# Patient Record
Sex: Female | Born: 1950
Health system: Southern US, Community
[De-identification: ages and names within clinical notes are randomized; demographics above are authoritative.]

## PROBLEM LIST (undated history)

## (undated) DIAGNOSIS — E559 Vitamin D deficiency, unspecified: Secondary | ICD-10-CM

## (undated) DIAGNOSIS — F5101 Primary insomnia: Secondary | ICD-10-CM

## (undated) DIAGNOSIS — R5382 Chronic fatigue, unspecified: Secondary | ICD-10-CM

## (undated) HISTORY — DX: Chronic fatigue, unspecified: R53.82

## (undated) HISTORY — PX: OTHER SURGICAL HISTORY: SHX169

## (undated) HISTORY — PX: VENTRAL HERNIA REPAIR: SHX424

## (undated) HISTORY — DX: Vitamin D deficiency, unspecified: E55.9

## (undated) HISTORY — DX: Primary insomnia: F51.01

---

## 2010-04-04 HISTORY — PX: COLON SURGERY: SHX602

## 2011-09-24 ENCOUNTER — Other Ambulatory Visit: Payer: Self-pay | Admitting: Surgery

## 2011-09-29 ENCOUNTER — Ambulatory Visit
Admission: RE | Admit: 2011-09-29 | Discharge: 2011-09-29 | Disposition: A | Discharge status: Home / Self Care | Payer: Self-pay | Source: Ambulatory Visit | Attending: Surgery | Admitting: Surgery

## 2011-09-29 MED ORDER — IOHEXOL 300 MG/ML  SOLN
100.0000 mL | Freq: Once | INTRAMUSCULAR | Status: AC | PRN
  Administered 2011-09-29: 100 mL via INTRAVENOUS

## 2013-08-10 IMAGING — CT CT ABD-PELV W/ CM
3 of 5 series · 11 of 36 positions shown, 17 images · IV contrast (READICAT/WATER & [ID] OMNI 300)
Comparison: CT abdomen pelvis of 09/25/2010

***ADDENDUM*** CREATED: 10/06/2011 [DATE]

Correction to the dictation of the CT abdomen and pelvis of
09/29/2011:
Under FINDINGS, second paragraph, the last sentence should read "
There also appears to be a small area of enhancement within the
musculature of the anterior lower pelvic abdominal wall consistent
with an abscess of approximately 2.6 x 1.7 cm."
***END ADDENDUM*** SIGNED BY: Nohora Esperanza Mikan, M.D.
CLINICAL DATA: Left lower quadrant tenderness and pain, history of
small bowel obstruction with surgery in April 2010 with
ileostomy resection
CT ABDOMEN AND PELVIS WITH CONTRAST
TECHNIQUE: Multidetector CT imaging of the abdomen and pelvis was
performed following the standard protocol during bolus
administration of intravenous contrast.
Contrast: 100mL OMNIPAQUE IOHEXOL 300 MG/ML  SOLN

[Series 3: abd/pelvis with · axial · 0.80mm/px · z∈[-364,-59]mm · 7 of 79 slices shown, 12 images]
[im 10/79  soft-tissue]
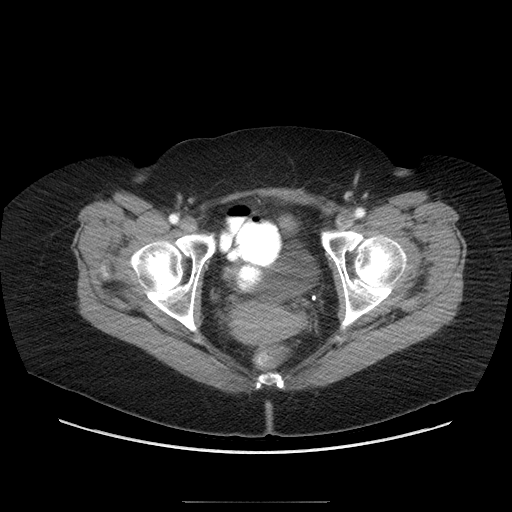
[im 10/79  bone]
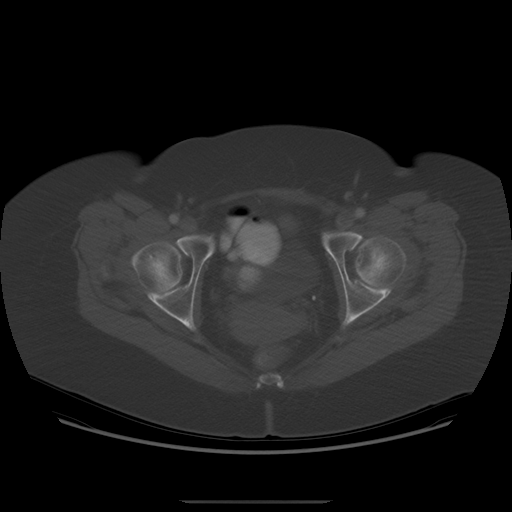
[im 20/79  soft-tissue]
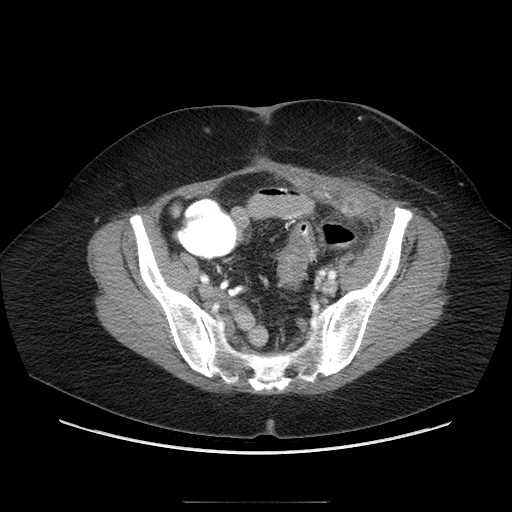
[im 30/79  soft-tissue]
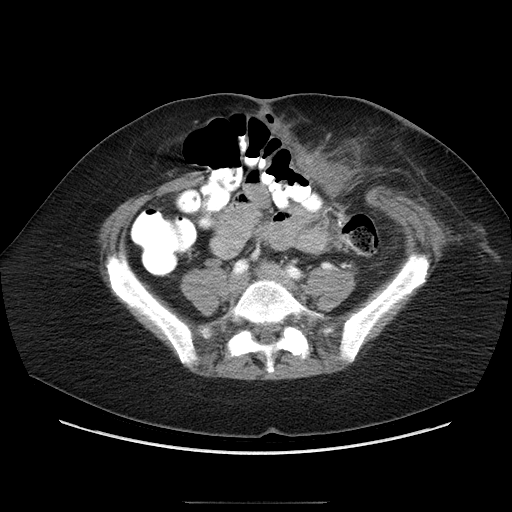
[im 40/79  soft-tissue]
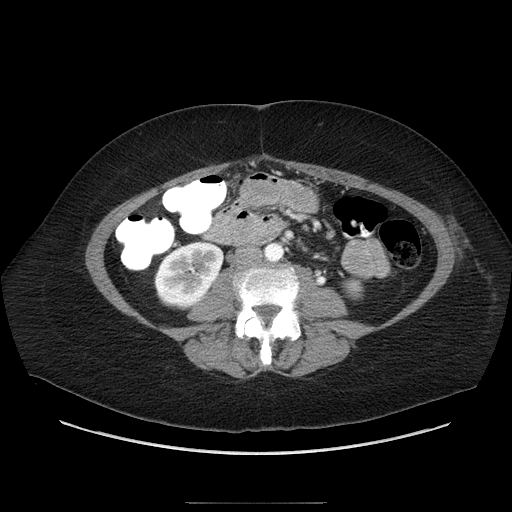
[im 40/79  lung]
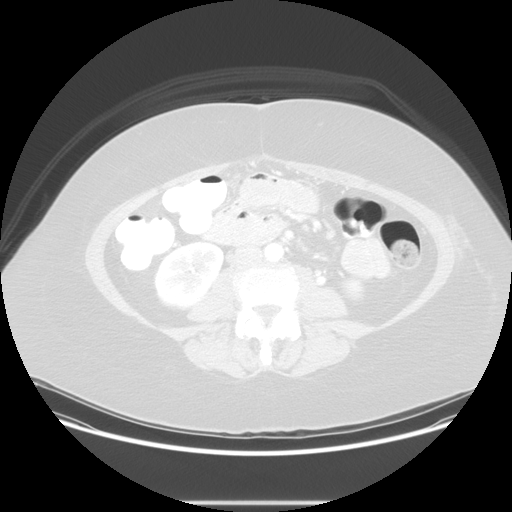
[im 49/79  soft-tissue]
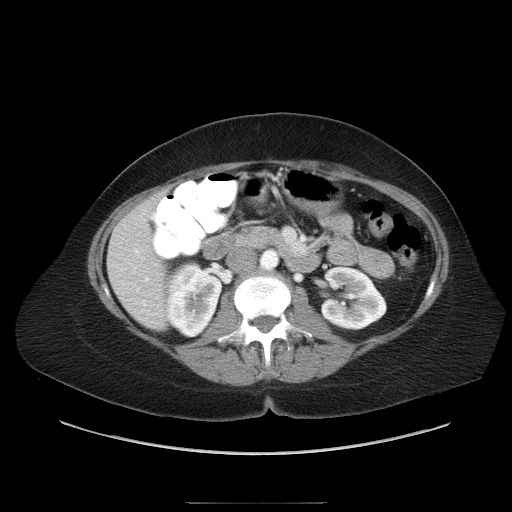
[im 49/79  lung]
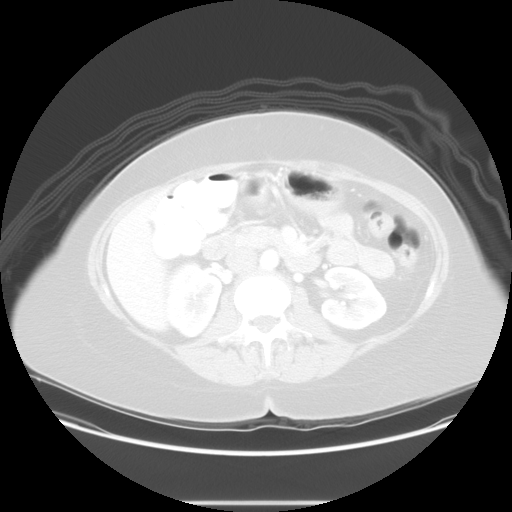
[im 59/79  soft-tissue]
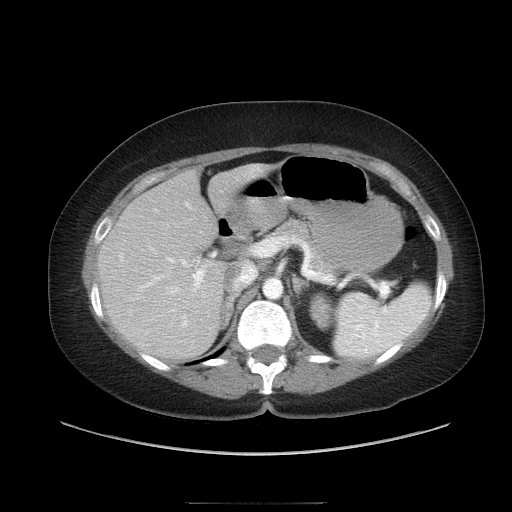
[im 59/79  lung]
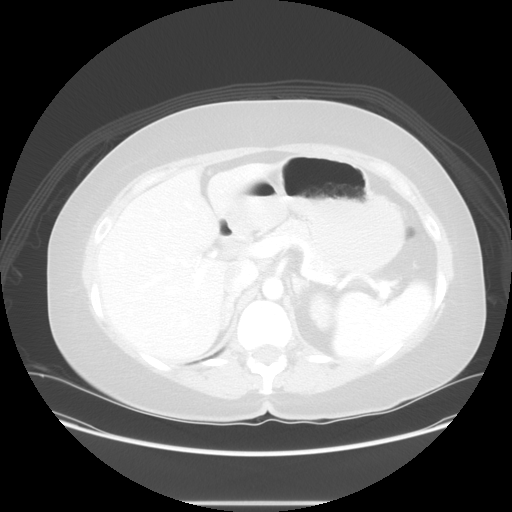
[im 69/79  soft-tissue]
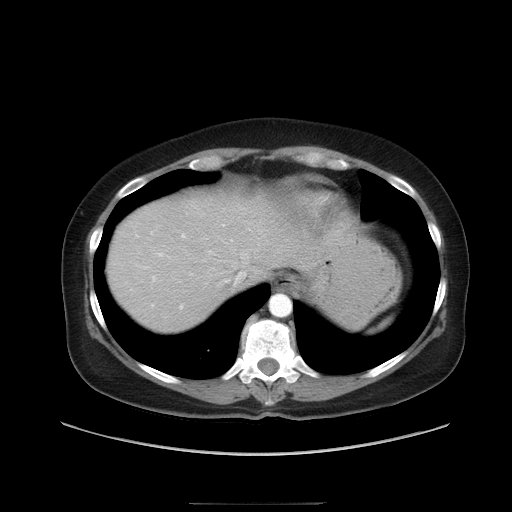
[im 69/79  lung]
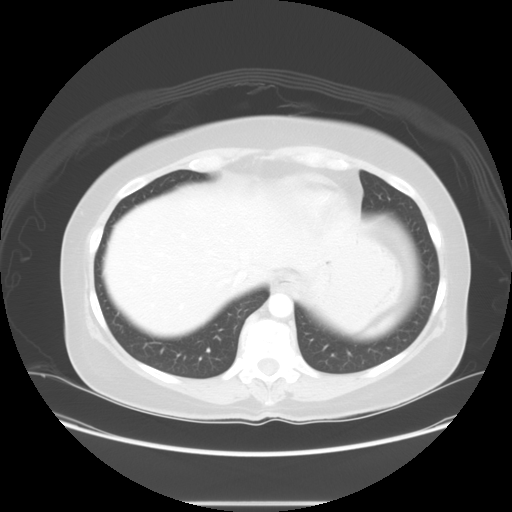

[Series 601: coronal body · coronal · 0.87mm/px · 1 of 115 slices shown, 2 images]
[im 39/115  soft-tissue]
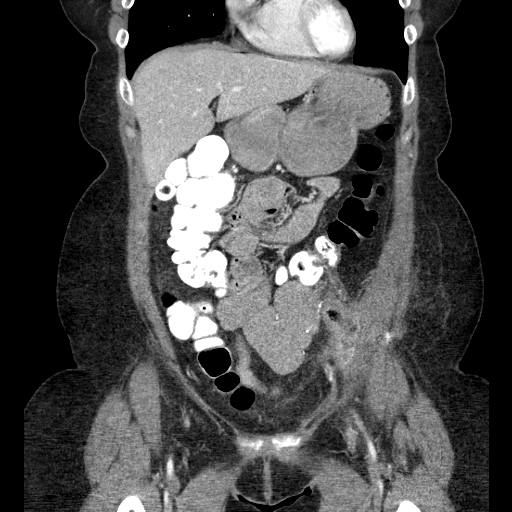
[im 39/115  bone]
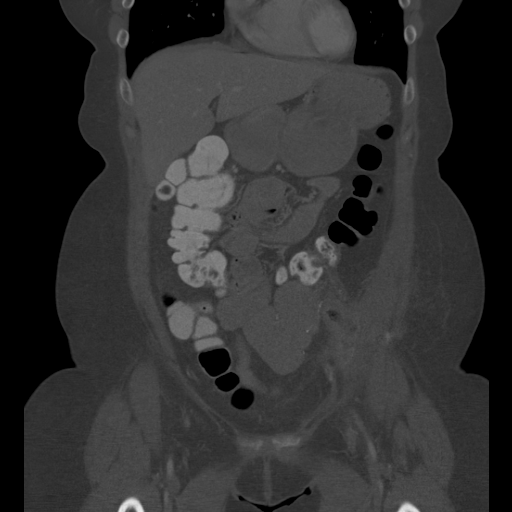

[Series 602: sagittal body · sagittal · 0.87mm/px · 3 of 166 slices shown]
[im 20/166  soft-tissue]
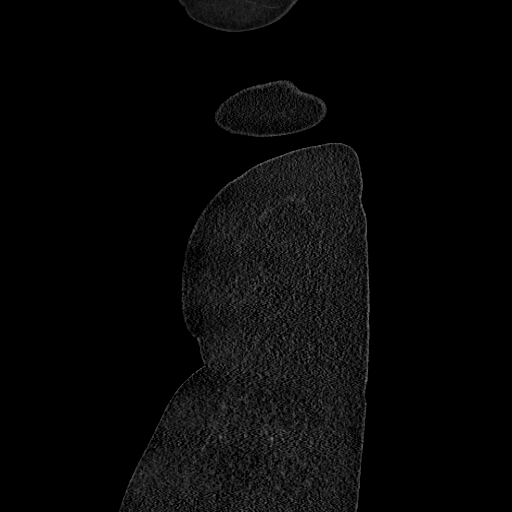
[im 39/166  soft-tissue]
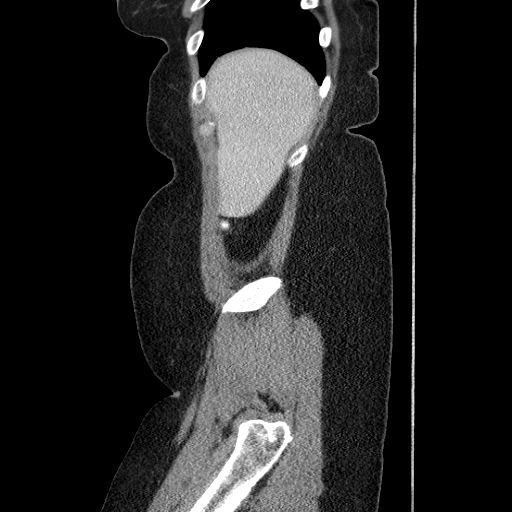
[im 59/166  soft-tissue]
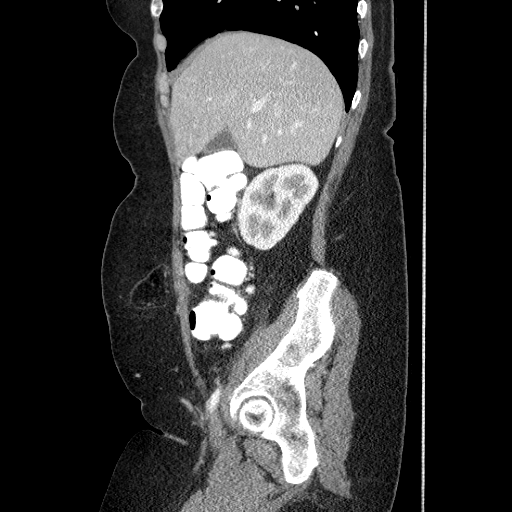

[11 of 36 positions shown; findings below may reference images not displayed]

FINDINGS: A calcified granuloma is again noted posteriorly within
the left lower lobe.  Otherwise the lung bases are clear. There is
some thickening of the pericardium anteriorly at the base of the
heart which could represent pericardial thickening or tiny effusion
which appears unchanged.  The liver enhances with no focal
abnormality and no ductal dilatation is noted.  No calcified
gallstones are seen.  The pancreas is normal in size and the
pancreatic duct is not dilated.  The adrenal glands and spleen are
unremarkable other than several calcified splenic granulomas.  The
stomach is moderately fluid distended with no abnormality noted.
The kidneys enhance and a cyst in the periphery of the right mid
kidney is stable.  On delayed images, the pelvocaliceal systems are
unremarkable.  The abdominal aorta is normal in caliber.  No
adenopathy is seen.

However, near the site of the resection of ileostomy, there is an
irregular collection of air within the subcutaneous soft tissues to
the left midline at the level of the umbilicus.  This collection
measures 6.2 x 3.8 cm and probably represents an abscess.  In
addition there appears to be a linear extension of this collection
toward the umbilicus which may represent a fistulous tract which
does contain air and some enhancement and/or possibly oral contrast
near the umbilicus.  This presumed abscess probably does
communicate with bowel within the left pelvis  best seen on axial
image number 55.  There also appears to be a small area of
enhancement within the musculature of the anterior lower pelvic
abdominal wall consistent with abscess of approximately 8-0.6 x
cm.

There is a hernia to the right of the umbilicus containing a
portion of the transverse colon which is not obstructed.

The uterus appears normal in size.  The urinary bladder is
decompressed.  No adnexal lesion is seen.  No free fluid is noted
within the pelvis.  No acute bony abnormality is seen.
IMPRESSION: 1.  Probable abscess within the subcutaneous soft tissues to the
left of the umbilicus which appears to extend by a fistulous tract
to a point near the umbilicus and may communicate with small bowel
within the left mid pelvis.
2.  Right periumbilical hernia containing a nondistended loop of
transverse colon.
3.  Area of enhancement within the musculature of the left anterior
pelvic abdominal wall consistent with inflammation or developing
abscess.

## 2015-08-15 DIAGNOSIS — R1084 Generalized abdominal pain: Secondary | ICD-10-CM | POA: Diagnosis not present

## 2015-08-15 DIAGNOSIS — R21 Rash and other nonspecific skin eruption: Secondary | ICD-10-CM | POA: Diagnosis not present

## 2015-08-15 DIAGNOSIS — R1032 Left lower quadrant pain: Secondary | ICD-10-CM | POA: Diagnosis not present

## 2015-09-03 DIAGNOSIS — K632 Fistula of intestine: Secondary | ICD-10-CM | POA: Diagnosis not present

## 2015-09-03 DIAGNOSIS — Z8719 Personal history of other diseases of the digestive system: Secondary | ICD-10-CM | POA: Diagnosis not present

## 2015-10-15 DIAGNOSIS — K316 Fistula of stomach and duodenum: Secondary | ICD-10-CM | POA: Diagnosis not present

## 2015-10-15 DIAGNOSIS — K565 Intestinal adhesions [bands] with obstruction (postprocedural) (postinfection): Secondary | ICD-10-CM | POA: Diagnosis not present

## 2015-10-15 DIAGNOSIS — I10 Essential (primary) hypertension: Secondary | ICD-10-CM | POA: Diagnosis not present

## 2015-10-15 DIAGNOSIS — R5383 Other fatigue: Secondary | ICD-10-CM | POA: Diagnosis not present

## 2015-10-15 DIAGNOSIS — R419 Unspecified symptoms and signs involving cognitive functions and awareness: Secondary | ICD-10-CM | POA: Diagnosis not present

## 2015-10-15 DIAGNOSIS — R5381 Other malaise: Secondary | ICD-10-CM | POA: Diagnosis not present

## 2015-10-15 DIAGNOSIS — Z8719 Personal history of other diseases of the digestive system: Secondary | ICD-10-CM | POA: Diagnosis not present

## 2015-10-15 DIAGNOSIS — E669 Obesity, unspecified: Secondary | ICD-10-CM | POA: Diagnosis not present

## 2015-10-15 DIAGNOSIS — S90121A Contusion of right lesser toe(s) without damage to nail, initial encounter: Secondary | ICD-10-CM | POA: Diagnosis not present

## 2015-10-16 DIAGNOSIS — Z8719 Personal history of other diseases of the digestive system: Secondary | ICD-10-CM | POA: Insufficient documentation

## 2015-10-16 DIAGNOSIS — K316 Fistula of stomach and duodenum: Secondary | ICD-10-CM | POA: Insufficient documentation

## 2015-10-16 DIAGNOSIS — K565 Intestinal adhesions [bands], unspecified as to partial versus complete obstruction: Secondary | ICD-10-CM | POA: Insufficient documentation

## 2015-10-16 DIAGNOSIS — I10 Essential (primary) hypertension: Secondary | ICD-10-CM | POA: Insufficient documentation

## 2016-04-24 DIAGNOSIS — L0390 Cellulitis, unspecified: Secondary | ICD-10-CM | POA: Diagnosis not present

## 2016-04-24 DIAGNOSIS — K632 Fistula of intestine: Secondary | ICD-10-CM | POA: Diagnosis not present

## 2016-05-16 DIAGNOSIS — T3695XA Adverse effect of unspecified systemic antibiotic, initial encounter: Secondary | ICD-10-CM | POA: Diagnosis not present

## 2016-05-16 DIAGNOSIS — R1084 Generalized abdominal pain: Secondary | ICD-10-CM | POA: Diagnosis not present

## 2016-05-16 DIAGNOSIS — K521 Toxic gastroenteritis and colitis: Secondary | ICD-10-CM | POA: Diagnosis not present

## 2016-06-26 DIAGNOSIS — E538 Deficiency of other specified B group vitamins: Secondary | ICD-10-CM | POA: Diagnosis not present

## 2016-06-26 DIAGNOSIS — K632 Fistula of intestine: Secondary | ICD-10-CM | POA: Diagnosis not present

## 2016-06-26 DIAGNOSIS — K912 Postsurgical malabsorption, not elsewhere classified: Secondary | ICD-10-CM | POA: Diagnosis not present

## 2016-06-26 DIAGNOSIS — R790 Abnormal level of blood mineral: Secondary | ICD-10-CM | POA: Diagnosis not present

## 2016-06-26 DIAGNOSIS — R252 Cramp and spasm: Secondary | ICD-10-CM | POA: Diagnosis not present

## 2016-06-26 DIAGNOSIS — R1032 Left lower quadrant pain: Secondary | ICD-10-CM | POA: Diagnosis not present

## 2016-07-23 DIAGNOSIS — K632 Fistula of intestine: Secondary | ICD-10-CM | POA: Diagnosis not present

## 2016-07-29 DIAGNOSIS — K912 Postsurgical malabsorption, not elsewhere classified: Secondary | ICD-10-CM | POA: Diagnosis not present

## 2016-07-29 DIAGNOSIS — K632 Fistula of intestine: Secondary | ICD-10-CM | POA: Diagnosis not present

## 2016-12-15 DIAGNOSIS — R195 Other fecal abnormalities: Secondary | ICD-10-CM | POA: Diagnosis not present

## 2016-12-15 DIAGNOSIS — K632 Fistula of intestine: Secondary | ICD-10-CM | POA: Diagnosis not present

## 2017-08-13 DIAGNOSIS — Z1159 Encounter for screening for other viral diseases: Secondary | ICD-10-CM | POA: Diagnosis not present

## 2017-08-13 DIAGNOSIS — Z1389 Encounter for screening for other disorder: Secondary | ICD-10-CM | POA: Diagnosis not present

## 2017-08-13 DIAGNOSIS — K632 Fistula of intestine: Secondary | ICD-10-CM | POA: Diagnosis not present

## 2017-08-13 DIAGNOSIS — E538 Deficiency of other specified B group vitamins: Secondary | ICD-10-CM | POA: Diagnosis not present

## 2017-08-13 DIAGNOSIS — Z13220 Encounter for screening for lipoid disorders: Secondary | ICD-10-CM | POA: Diagnosis not present

## 2017-08-13 DIAGNOSIS — R790 Abnormal level of blood mineral: Secondary | ICD-10-CM | POA: Diagnosis not present

## 2017-08-13 DIAGNOSIS — R1084 Generalized abdominal pain: Secondary | ICD-10-CM | POA: Diagnosis not present

## 2017-08-13 DIAGNOSIS — Z131 Encounter for screening for diabetes mellitus: Secondary | ICD-10-CM | POA: Diagnosis not present

## 2017-08-13 DIAGNOSIS — R5383 Other fatigue: Secondary | ICD-10-CM | POA: Diagnosis not present

## 2017-08-13 DIAGNOSIS — Z0001 Encounter for general adult medical examination with abnormal findings: Secondary | ICD-10-CM | POA: Diagnosis not present

## 2017-08-13 DIAGNOSIS — K912 Postsurgical malabsorption, not elsewhere classified: Secondary | ICD-10-CM | POA: Diagnosis not present

## 2017-08-13 DIAGNOSIS — Z1331 Encounter for screening for depression: Secondary | ICD-10-CM | POA: Diagnosis not present

## 2017-08-31 DIAGNOSIS — K432 Incisional hernia without obstruction or gangrene: Secondary | ICD-10-CM | POA: Diagnosis not present

## 2017-08-31 DIAGNOSIS — Z6830 Body mass index (BMI) 30.0-30.9, adult: Secondary | ICD-10-CM | POA: Diagnosis not present

## 2017-09-03 DIAGNOSIS — K432 Incisional hernia without obstruction or gangrene: Secondary | ICD-10-CM | POA: Diagnosis not present

## 2017-09-03 DIAGNOSIS — K760 Fatty (change of) liver, not elsewhere classified: Secondary | ICD-10-CM | POA: Diagnosis not present

## 2017-09-03 DIAGNOSIS — R109 Unspecified abdominal pain: Secondary | ICD-10-CM | POA: Diagnosis not present

## 2017-09-10 DIAGNOSIS — K632 Fistula of intestine: Secondary | ICD-10-CM | POA: Diagnosis not present

## 2017-09-10 DIAGNOSIS — K432 Incisional hernia without obstruction or gangrene: Secondary | ICD-10-CM | POA: Diagnosis not present

## 2018-01-06 LAB — HM COLONOSCOPY

## 2018-05-11 DIAGNOSIS — Z9889 Other specified postprocedural states: Secondary | ICD-10-CM | POA: Diagnosis not present

## 2018-05-25 DIAGNOSIS — R188 Other ascites: Secondary | ICD-10-CM | POA: Diagnosis not present

## 2018-05-25 DIAGNOSIS — Z9889 Other specified postprocedural states: Secondary | ICD-10-CM | POA: Diagnosis not present

## 2018-05-25 DIAGNOSIS — L02211 Cutaneous abscess of abdominal wall: Secondary | ICD-10-CM | POA: Diagnosis not present

## 2018-11-01 DIAGNOSIS — K432 Incisional hernia without obstruction or gangrene: Secondary | ICD-10-CM | POA: Diagnosis not present

## 2018-11-02 DIAGNOSIS — F5101 Primary insomnia: Secondary | ICD-10-CM | POA: Diagnosis not present

## 2018-11-02 DIAGNOSIS — R51 Headache: Secondary | ICD-10-CM | POA: Diagnosis not present

## 2018-11-30 ENCOUNTER — Other Ambulatory Visit: Payer: Self-pay

## 2019-02-09 DIAGNOSIS — K912 Postsurgical malabsorption, not elsewhere classified: Secondary | ICD-10-CM | POA: Diagnosis not present

## 2019-02-09 DIAGNOSIS — Z0000 Encounter for general adult medical examination without abnormal findings: Secondary | ICD-10-CM | POA: Diagnosis not present

## 2019-02-09 DIAGNOSIS — Z6831 Body mass index (BMI) 31.0-31.9, adult: Secondary | ICD-10-CM | POA: Diagnosis not present

## 2019-02-09 DIAGNOSIS — E668 Other obesity: Secondary | ICD-10-CM | POA: Diagnosis not present

## 2019-08-17 ENCOUNTER — Encounter: Payer: Self-pay | Admitting: Family Medicine

## 2019-08-17 ENCOUNTER — Other Ambulatory Visit: Payer: Self-pay

## 2019-08-17 ENCOUNTER — Ambulatory Visit (INDEPENDENT_AMBULATORY_CARE_PROVIDER_SITE_OTHER): Payer: PPO | Admitting: Family Medicine

## 2019-08-17 VITALS — BP 106/54 | HR 91 | Temp 97.4°F | Ht 63.0 in | Wt 185.0 lb

## 2019-08-17 DIAGNOSIS — G5603 Carpal tunnel syndrome, bilateral upper limbs: Secondary | ICD-10-CM | POA: Diagnosis not present

## 2019-08-17 DIAGNOSIS — R202 Paresthesia of skin: Secondary | ICD-10-CM | POA: Diagnosis not present

## 2019-08-17 DIAGNOSIS — M542 Cervicalgia: Secondary | ICD-10-CM

## 2019-08-17 MED ORDER — CYCLOBENZAPRINE HCL 5 MG PO TABS: 5.0000 mg | ORAL_TABLET | Freq: Three times a day (TID) | ORAL | 1 refills | Status: DC | PRN

## 2019-08-17 NOTE — Patient Instructions (Addendum)
Recommend Ibuprofen (advil or motrin) 200 mg 2 pills every 6 hours x 1 week. Start flexeril 5 mg one three times a day as needed. Ice or heat.  If doesn't improve, may call and we will refer to physical therapy.  Consider carpal tunnel braces.  Cervical Strain and Sprain Rehab Ask your health care provider which exercises are safe for you. Do exercises exactly as told by your health care provider and adjust them as directed. It is normal to feel mild stretching, pulling, tightness, or discomfort as you do these exercises. Stop right away if you feel sudden pain or your pain gets worse. Do not begin these exercises until told by your health care provider. Stretching and range-of-motion exercises Cervical side bending  1. Using good posture, sit on a stable chair or stand up. 2. Without moving your shoulders, slowly tilt your left / right ear to your shoulder until you feel a stretch in the opposite side neck muscles. You should be looking straight ahead. 3. Hold for __________ seconds. 4. Repeat with the other side of your neck. Repeat __________ times. Complete this exercise __________ times a day. Cervical rotation  1. Using good posture, sit on a stable chair or stand up. 2. Slowly turn your head to the side as if you are looking over your left / right shoulder. ? Keep your eyes level with the ground. ? Stop when you feel a stretch along the side and the back of your neck. 3. Hold for __________ seconds. 4. Repeat this by turning to your other side. Repeat __________ times. Complete this exercise __________ times a day. Thoracic extension and pectoral stretch 1. Roll a towel or a small blanket so it is about 4 inches (10 cm) in diameter. 2. Lie down on your back on a firm surface. 3. Put the towel lengthwise, under your spine in the middle of your back. It should not be under your shoulder blades. The towel should line up with your spine from your middle back to your lower back. 4. Put  your hands behind your head and let your elbows fall out to your sides. 5. Hold for __________ seconds. Repeat __________ times. Complete this exercise __________ times a day. Strengthening exercises Isometric upper cervical flexion 1. Lie on your back with a thin pillow behind your head and a small rolled-up towel under your neck. 2. Gently tuck your chin toward your chest and nod your head down to look toward your feet. Do not lift your head off the pillow. 3. Hold for __________ seconds. 4. Release the tension slowly. Relax your neck muscles completely before you repeat this exercise. Repeat __________ times. Complete this exercise __________ times a day. Isometric cervical extension  1. Stand about 6 inches (15 cm) away from a wall, with your back facing the wall. 2. Place a soft object, about 6-8 inches (15-20 cm) in diameter, between the back of your head and the wall. A soft object could be a small pillow, a ball, or a folded towel. 3. Gently tilt your head back and press into the soft object. Keep your jaw and forehead relaxed. 4. Hold for __________ seconds. 5. Release the tension slowly. Relax your neck muscles completely before you repeat this exercise. Repeat __________ times. Complete this exercise __________ times a day. Posture and body mechanics Body mechanics refers to the movements and positions of your body while you do your daily activities. Posture is part of body mechanics. Good posture and healthy body mechanics  can help to relieve stress in your body's tissues and joints. Good posture means that your spine is in its natural S-curve position (your spine is neutral), your shoulders are pulled back slightly, and your head is not tipped forward. The following are general guidelines for applying improved posture and body mechanics to your everyday activities. Sitting  1. When sitting, keep your spine neutral and keep your feet flat on the floor. Use a footrest, if necessary,  and keep your thighs parallel to the floor. Avoid rounding your shoulders, and avoid tilting your head forward. 2. When working at a desk or a computer, keep your desk at a height where your hands are slightly lower than your elbows. Slide your chair under your desk so you are close enough to maintain good posture. 3. When working at a computer, place your monitor at a height where you are looking straight ahead and you do not have to tilt your head forward or downward to look at the screen. Standing   When standing, keep your spine neutral and keep your feet about hip-width apart. Keep a slight bend in your knees. Your ears, shoulders, and hips should line up.  When you do a task in which you stand in one place for a long time, place one foot up on a stable object that is 2-4 inches (5-10 cm) high, such as a footstool. This helps keep your spine neutral. Resting When lying down and resting, avoid positions that are most painful for you. Try to support your neck in a neutral position. You can use a contour pillow or a small rolled-up towel. Your pillow should support your neck but not push on it. This information is not intended to replace advice given to you by your health care provider. Make sure you discuss any questions you have with your health care provider. Document Revised: 08/10/2018 Document Reviewed: 01/19/2018 Elsevier Patient Education  2020 [employer]    Carpal Tunnel Syndrome  Carpal tunnel syndrome is a condition that causes pain in your hand and arm. The carpal tunnel is a narrow area that is on the palm side of your wrist. Repeated wrist motion or certain diseases may cause swelling in the tunnel. This swelling can pinch the main nerve in the wrist (median nerve). What are the causes? This condition may be caused by:  Repeated wrist motions.  Wrist injuries.  Arthritis.  A sac of fluid (cyst) or abnormal growth (tumor) in the carpal tunnel.  Fluid buildup during  pregnancy. Sometimes the cause is not known. What increases the risk? The following factors may make you more likely to develop this condition:  Having a job in which you move your wrist in the same way many times. This includes jobs like being a Midwife or a Conservation officer, nature.  Being a woman.  Having other health conditions, such as: ? Diabetes. ? Obesity. ? A thyroid gland that is not active enough (hypothyroidism). ? Kidney failure. What are the signs or symptoms? Symptoms of this condition include:  A tingling feeling in your fingers.  Tingling or a loss of feeling (numbness) in your hand.  Pain in your entire arm. This pain may get worse when you bend your wrist and elbow for a long time.  Pain in your wrist that goes up your arm to your shoulder.  Pain that goes down into your palm or fingers.  A weak feeling in your hands. You may find it hard to grab and hold items. You may  feel worse at night. How is this diagnosed? This condition is diagnosed with a medical history and physical exam. You may also have tests, such as:  Electromyogram (EMG). This test checks the signals that the nerves send to the muscles.  Nerve conduction study. This test checks how well signals pass through your nerves.  Imaging tests, such as X-rays, ultrasound, and MRI. These tests check for what might be the cause of your condition. How is this treated? This condition may be treated with:  Lifestyle changes. You will be asked to stop or change the activity that caused your problem.  Doing exercise and activities that make bones and muscles stronger (physical therapy).  Learning how to use your hand again (occupational therapy).  Medicines for pain and swelling (inflammation). You may have injections in your wrist.  A wrist splint.  Surgery. Follow these instructions at home: If you have a splint:  Wear the splint as told by your doctor. Remove it only as told by your doctor.  Loosen the  splint if your fingers: ? Tingle. ? Lose feeling (become numb). ? Turn cold and blue.  Keep the splint clean.  If the splint is not waterproof: ? Do not let it get wet. ? Cover it with a watertight covering when you take a bath or a shower. Managing pain, stiffness, and swelling   If told, put ice on the painful area: ? If you have a removable splint, remove it as told by your doctor. ? Put ice in a plastic bag. ? Place a towel between your skin and the bag. ? Leave the ice on for 20 minutes, 2-3 times per day. General instructions  Take over-the-counter and prescription medicines only as told by your doctor.  Rest your wrist from any activity that may cause pain. If needed, talk with your boss at work about changes that can help your wrist heal.  Do any exercises as told by your doctor, physical therapist, or occupational therapist.  Keep all follow-up visits as told by your doctor. This is important. Contact a doctor if:  You have new symptoms.  Medicine does not help your pain.  Your symptoms get worse. Get help right away if:  You have very bad numbness or tingling in your wrist or hand. Summary  Carpal tunnel syndrome is a condition that causes pain in your hand and arm.  It is often caused by repeated wrist motions.  Lifestyle changes and medicines are used to treat this problem. Surgery may help in very bad cases.  Follow your doctor's instructions about wearing a splint, resting your wrist, keeping follow-up visits, and calling for help. This information is not intended to replace advice given to you by your health care provider. Make sure you discuss any questions you have with your health care provider. Document Revised: 08/27/2017 Document Reviewed: 08/27/2017 Elsevier Patient Education  2020 [employer].

## 2019-08-17 NOTE — Progress Notes (Signed)
Established Patient Office Visit  Subjective:  Patient ID: Susan Holloway, female    DOB: April 26, 1951  Age: 69 y.o. MRN: [id number]  CC:  Chief Complaint  Patient presents with  . Neck Pain    x 4 days. Rates pain 10/10. Pain started on right side, the migrated to the left then back to right. Now hurting at the base of her skull. Describes pain as dull, aching, throbbing. heat made her pain worse. Has not tried any medications for it.  . Parasthesia    BL forearms and hands x 6 months    HPI Susan Holloway presents for x 4 days. Rates pain 10/10. Pain started on right side, the migrated to the left then back to right. Now hurting at the base of her skull. Describes pain as dull, aching, throbbing. heat made her pain worse. Has not tried any medications for it.  She believes her symptoms started when she had a zirconium tooth implant done by Dr. Berdine Addison.  She did see a second dentist Dr. Terance Hart who did not confirm this as the cause.  Past Medical History:  Diagnosis Date  . Chronic fatigue   . Primary insomnia   . Vitamin D deficiency     Past Surgical History:  Procedure Laterality Date  . COLON SURGERY  04/04/2010  . double fistula    . VENTRAL HERNIA REPAIR      Family History  Problem Relation Age of Onset  . Alzheimer's disease Mother   . Parkinson's disease Father   . Diabetes Maternal Grandmother   . Prostate cancer Other     Social History   Socioeconomic History  . Marital status: Divorced    Spouse name: Not on file  . Number of children: 2  . Years of education: Not on file  . Highest education level: Not on file  Occupational History  . Occupation: Retired  Tobacco Use  . Smoking status: Never Smoker  . Smokeless tobacco: Never Used  Substance and Sexual Activity  . Alcohol use: Yes    Comment: twice a year  . Drug use: Never  . Sexual activity: Not on file  Other Topics Concern  . Not on file  Social History Narrative  . Not on file    Social Determinants of Health   Financial Resource Strain:   . Difficulty of Paying Living Expenses:   Food Insecurity:   . Worried About Charity fundraiser in the Last Year:   . Arboriculturist in the Last Year:   Transportation Needs:   . Film/video editor (Medical):   Marland Kitchen Lack of Transportation (Non-Medical):   Physical Activity:   . Days of Exercise per Week:   . Minutes of Exercise per Session:   Stress:   . Feeling of Stress :   Social Connections:   . Frequency of Communication with Friends and Family:   . Frequency of Social Gatherings with Friends and Family:   . Attends Religious Services:   . Active Member of Clubs or Organizations:   . Attends Archivist Meetings:   Marland Kitchen Marital Status:   Intimate Partner Violence:   . Fear of Current or Ex-Partner:   . Emotionally Abused:   Marland Kitchen Physically Abused:   . Sexually Abused:     No outpatient medications prior to visit.   No facility-administered medications prior to visit.    Allergies  Allergen Reactions  . Ciprofloxacin Itching  . Levofloxacin Other (See  Comments)    itching  . Metronidazole Other (See Comments)    itching    ROS Review of Systems  Constitutional: Negative for chills, fatigue and fever.  HENT: Positive for rhinorrhea. Negative for congestion, ear pain and sore throat.        "head feels full"  Respiratory: Negative for cough and shortness of breath.   Cardiovascular: Negative for chest pain.  Gastrointestinal: Negative for abdominal pain, constipation, diarrhea, nausea and vomiting.  Genitourinary: Negative for dysuria and urgency.  Musculoskeletal: Positive for neck pain. Negative for back pain and myalgias.  Neurological: Positive for numbness (and tingling from BL forearms to hands. Has been going on for 6-7 months. Wrists feel tight.  Better today. ) and headaches (from neck pain). Negative for dizziness, weakness and light-headedness.  Psychiatric/Behavioral: Negative  for dysphoric mood. The patient is not nervous/anxious.       Objective:    Physical Exam  Constitutional: She is oriented to person, place, and time. She appears well-developed and well-nourished.  Cardiovascular: Normal rate, regular rhythm and normal heart sounds.  Pulmonary/Chest: Effort normal and breath sounds normal.  Musculoskeletal:        General: Tenderness (tenderness on BL necks (Left > Right)  Poor ROM of neck due to discomfort,) present.  Neurological: She is alert and oriented to person, place, and time.  Positive tinels and phalens on BL hands.    Psychiatric: She has a normal mood and affect. Her behavior is normal.    BP (!) 106/54 (BP Location: Left Wrist, Patient Position: Sitting)   Pulse 91   Temp (!) 97.4 F (36.3 C) (Temporal)   Ht 5' 3"  (1.6 m)   Wt 185 lb (83.9 kg)   SpO2 99%   BMI 32.77 kg/m  Wt Readings from Last 3 Encounters:  08/17/19 185 lb (83.9 kg)     Health Maintenance Due  Topic Date Due  . Hepatitis C Screening  Never done  . COVID-19 Vaccine (1) Never done  . TETANUS/TDAP  Never done  . MAMMOGRAM  Never done  . COLONOSCOPY  Never done  . DEXA SCAN  Never done  . PNA vac Low Risk Adult (1 of 2 - PCV13) Never done    There are no preventive care reminders to display for this patient.  No results found for: TSH No results found for: WBC, HGB, HCT, MCV, PLT No results found for: NA, K, CHLORIDE, CO2, GLUCOSE, BUN, CREATININE, BILITOT, ALKPHOS, AST, ALT, PROT, ALBUMIN, CALCIUM, ANIONGAP, EGFR, GFR No results found for: CHOL No results found for: HDL No results found for: LDLCALC No results found for: TRIG No results found for: CHOLHDL No results found for: HGBA1C    Assessment & Plan:  1. Neck pain 2. Bilateral carpal tunnel syndrome 3. Paresthesias  Recommend Ibuprofen (advil or motrin) 200 mg 2 pills every 6 hours x 1 week. Start flexeril 5 mg one three times a day as needed. Ice or heat.  If doesn't improve, may call  and we will refer to physical therapy.  Consider carpal tunnel braces.  Follow-up: Return if symptoms worsen or fail to improve.   Rochel Brome, MD

## 2019-08-27 ENCOUNTER — Encounter: Payer: Self-pay | Admitting: Family Medicine

## 2019-08-27 DIAGNOSIS — R202 Paresthesia of skin: Secondary | ICD-10-CM | POA: Insufficient documentation

## 2019-08-27 DIAGNOSIS — M542 Cervicalgia: Secondary | ICD-10-CM | POA: Insufficient documentation

## 2019-08-27 DIAGNOSIS — G5603 Carpal tunnel syndrome, bilateral upper limbs: Secondary | ICD-10-CM | POA: Insufficient documentation

## 2020-02-13 ENCOUNTER — Other Ambulatory Visit: Payer: Self-pay

## 2020-02-13 ENCOUNTER — Ambulatory Visit (INDEPENDENT_AMBULATORY_CARE_PROVIDER_SITE_OTHER): Payer: PPO | Admitting: Family Medicine

## 2020-02-13 ENCOUNTER — Encounter: Payer: Self-pay | Admitting: Family Medicine

## 2020-02-13 VITALS — BP 124/72 | HR 77 | Temp 96.5°F | Ht 63.0 in | Wt 187.0 lb

## 2020-02-13 DIAGNOSIS — Z0001 Encounter for general adult medical examination with abnormal findings: Secondary | ICD-10-CM

## 2020-02-13 DIAGNOSIS — Z6833 Body mass index (BMI) 33.0-33.9, adult: Secondary | ICD-10-CM

## 2020-02-13 DIAGNOSIS — M25561 Pain in right knee: Secondary | ICD-10-CM | POA: Diagnosis not present

## 2020-02-13 DIAGNOSIS — E6609 Other obesity due to excess calories: Secondary | ICD-10-CM | POA: Diagnosis not present

## 2020-02-13 DIAGNOSIS — Z1211 Encounter for screening for malignant neoplasm of colon: Secondary | ICD-10-CM | POA: Diagnosis not present

## 2020-02-13 DIAGNOSIS — J3089 Other allergic rhinitis: Secondary | ICD-10-CM | POA: Diagnosis not present

## 2020-02-13 DIAGNOSIS — M542 Cervicalgia: Secondary | ICD-10-CM | POA: Diagnosis not present

## 2020-02-13 NOTE — Progress Notes (Signed)
Subjective:  Patient ID: Susan Holloway, female    DOB: 07-09-1950  Age: 69 y.o. MRN: [id number]  Chief Complaint  Patient presents with  . Annual Exam    HPI Encounter for general adult medical examination without abnormal findings  Physical ("At Risk" items are starred): Patient's last physical exam was 1 year ago .  Smoking: Life-long non-smoker ;  Physical Activity: Very active ;  Alcohol/Drug Use: Is a non-drinker ; No illicit drug use ;  Patient is not afflicted from Stress Incontinence and Urge Incontinence  Safety: reviewed ; Patient wears a seat belt, has smoke detectors,  practices appropriate gun safety- does not own a gun, and does not wear sunscreen with extended sun exposure. Dental Care: biannual cleanings, brushes and flosses daily. Ophthalmology/Optometry: Annual visit.  Hearing loss: none Vision impairments: none Has not done mammogram, dexa.  Refuses. Has not done pap smear.  Refuses.  Patient has a history of hypertension but is currently not on any medications.  Her blood pressure appears well controlled.  Patient has a list of lab work that she desires to have drawn today.  Fall Risk  02/13/2020 11/30/2018  Falls in the past year? 1 (No Data)  Comment - Emmi Telephone Survey: data to providers prior to load  Number falls in past yr: 1 (No Data)  Comment - Emmi Telephone Survey Actual Response =   Injury with Fall? 0 -     Depression screen [facility] 2/9 02/13/2020  Decreased Interest 0  Down, Depressed, Hopeless 0  PHQ - 2 Score 0       Functional Status Survey: Is the patient deaf or have difficulty hearing?: No Does the patient have difficulty seeing, even when wearing glasses/contacts?: No Does the patient have difficulty concentrating, remembering, or making decisions?: No Does the patient have difficulty walking or climbing stairs?: No Does the patient have difficulty dressing or bathing?: No Does the patient have difficulty doing errands alone  such as visiting a doctor's office or shopping?: No   Social Hx   Social History   Socioeconomic History  . Marital status: Divorced    Spouse name: Not on file  . Number of children: 2  . Years of education: Not on file  . Highest education level: Not on file  Occupational History  . Occupation: Retired  Tobacco Use  . Smoking status: Never Smoker  . Smokeless tobacco: Never Used  Substance and Sexual Activity  . Alcohol use: Yes    Comment: twice a year  . Drug use: Never  . Sexual activity: Not on file  Other Topics Concern  . Not on file  Social History Narrative  . Not on file   Social Determinants of Health   Financial Resource Strain:   . Difficulty of Paying Living Expenses: Not on file  Food Insecurity:   . Worried About Programme researcher, broadcasting/film/video in the Last Year: Not on file  . Ran Out of Food in the Last Year: Not on file  Transportation Needs:   . Lack of Transportation (Medical): Not on file  . Lack of Transportation (Non-Medical): Not on file  Physical Activity:   . Days of Exercise per Week: Not on file  . Minutes of Exercise per Session: Not on file  Stress:   . Feeling of Stress : Not on file  Social Connections:   . Frequency of Communication with Friends and Family: Not on file  . Frequency of Social Gatherings with Friends and Family:  Not on file  . Attends Religious Services: Not on file  . Active Member of Clubs or Organizations: Not on file  . Attends Banker Meetings: Not on file  . Marital Status: Not on file   Past Medical History:  Diagnosis Date  . Chronic fatigue   . Primary insomnia   . Vitamin D deficiency    Family History  Problem Relation Age of Onset  . Alzheimer's disease Mother   . Parkinson's disease Father   . Diabetes Maternal Grandmother   . Prostate cancer Other     Review of Systems  Constitutional: Negative for chills, fatigue and fever.  HENT: Positive for congestion (Her congestion has been going on  for 9 to 12 months and it is consistent.  Nothing seems to help.  She has tried a Engineer, materials pot.  And some type of over-the-counter nasal spray.) and rhinorrhea (uses nasal spray (unsure what kind) she has not tried over-the-counter antihistamines.). Negative for ear pain and sore throat.   Respiratory: Negative for cough and shortness of breath.   Cardiovascular: Negative for chest pain.  Gastrointestinal: Negative for abdominal pain, constipation, diarrhea, nausea and vomiting.  Genitourinary: Negative for dysuria and urgency.  Musculoskeletal: Positive for arthralgias (Knee pain). Negative for myalgias.  Skin: Negative for rash.  Neurological: Negative for dizziness and headaches.  Psychiatric/Behavioral: Negative for dysphoric mood. The patient is not nervous/anxious.      Objective:  BP 124/72   Pulse 77   Temp (!) 96.5 F (35.8 C)   Ht 5\' 3"  (1.6 m)   Wt 187 lb (84.8 kg)   SpO2 100%   BMI 33.13 kg/m   BP/Weight 02/13/2020 08/17/2019  Systolic BP 124 106  Diastolic BP 72 54  Wt. (Lbs) 187 185  BMI 33.13 32.77    Physical Exam  No results found for: WBC, HGB, HCT, PLT, GLUCOSE, CHOL, TRIG, HDL, LDLDIRECT, LDLCALC, ALT, AST, NA, K, CL, CREATININE, BUN, CO2, TSH, PSA, INR, GLUF, HGBA1C, MICROALBUR    Assessment & Plan:  1. Encounter for routine adult medical exam with abnormal findings Fairly healthy female.  Patient does not wish to have mammogram or bone densities.  She did agree to the 08/19/2019.  Refuses colonoscopies however.  Continue to work on eating healthy and exercise.  Recommend weight loss. - CBC with Differential/Platelet - Comprehensive metabolic panel - Hemoglobin A1c - Lipid panel - TSH - Thyroid Panel With TSH - Zinc - Vitamin C - SARS-CoV-2 Antibodies - Ambulatory referral to Gastroenterology - VITAMIN D 25 Hydroxy (Vit-D Deficiency, Fractures)  2. Acute pain of right knee Refused medicines.  Consider x-ray of knee if not improving.  Consider  physical therapy referral also. Recommend knee exercise.  Recommend continue to work on eating healthy diet and exercise.  3. Non-seasonal allergic rhinitis due to other allergic trigger Avoid oxymetolazone in nasal sprays due to rebound congestion which I explained to her.  McNeil sinus rinse given.  4. Colon cancer screening  Agreed to cologuard.   5. Class 1 obesity due to excess calories without serious comorbidity with body mass index (BMI) of 33.0 to 33.9 in adult  Recommend continue to work on eating healthy diet and exercise.  6. Neck pain Recommended neck exercises.   No orders of the defined types were placed in this encounter.  This is a list of the screening recommended for you and due dates:  Health Maintenance  Topic Date Due  .  Hepatitis C: One time screening  is recommended by Center for Disease Control  (CDC) for  adults born from [birth year] through 1963.   Never done  . COVID-19 Vaccine (1) 02/29/2020*  . Flu Shot  08/01/2020*  . Mammogram  02/12/2021*  . DEXA scan (bone density measurement)  02/12/2021*  . Tetanus Vaccine  02/12/2021*  . Pneumonia vaccines (1 of 2 - PCV13) 02/12/2021*  . Colon Cancer Screening  07/07/2020  *Topic was postponed. The date shown is not the original due date.     AN INDIVIDUALIZED CARE PLAN: was established or reinforced today.   SELF MANAGEMENT: The patient and I together assessed ways to personally work towards obtaining the recommended goals  Support needs The patient and/or family needs were assessed and services were offered and not necessary at this time.    Follow-up: No follow-ups on file. [facility] [phone]

## 2020-02-13 NOTE — Patient Instructions (Addendum)
Check with insurance if they pay for a 99[address] (physical for over 67 yo) An annual wellness visit is coded G0439.  Avoid oxymetolazone in nasal sprays.  Recommend neck exercises.  Recommend knee exercise.  Recommend continue to work on eating healthy diet and exercise.  Journal for Nurse Practitioners, 15(4), 263-267. Retrieved February 07, 2018 from http://clinicalkey.com/nursing">  Knee Exercises Ask your health care provider which exercises are safe for you. Do exercises exactly as told by your health care provider and adjust them as directed. It is normal to feel mild stretching, pulling, tightness, or discomfort as you do these exercises. Stop right away if you feel sudden pain or your pain gets worse. Do not begin these exercises until told by your health care provider. Stretching and range-of-motion exercises These exercises warm up your muscles and joints and improve the movement and flexibility of your knee. These exercises also help to relieve pain and swelling. Knee extension, prone 1. Lie on your abdomen (prone position) on a bed. 2. Place your left / right knee just beyond the edge of the surface so your knee is not on the bed. You can put a towel under your left / right thigh just above your kneecap for comfort. 3. Relax your leg muscles and allow gravity to straighten your knee (extension). You should feel a stretch behind your left / right knee. 4. Hold this position for __________ seconds. 5. Scoot up so your knee is supported between repetitions. Repeat __________ times. Complete this exercise __________ times a day. Knee flexion, active  1. Lie on your back with both legs straight. If this causes back discomfort, bend your left / right knee so your foot is flat on the floor. 2. Slowly slide your left / right heel back toward your buttocks. Stop when you feel a gentle stretch in the front of your knee or thigh (flexion). 3. Hold this position for __________ seconds. 4. Slowly  slide your left / right heel back to the starting position. Repeat __________ times. Complete this exercise __________ times a day. Quadriceps stretch, prone  1. Lie on your abdomen on a firm surface, such as a bed or padded floor. 2. Bend your left / right knee and hold your ankle. If you cannot reach your ankle or pant leg, loop a belt around your foot and grab the belt instead. 3. Gently pull your heel toward your buttocks. Your knee should not slide out to the side. You should feel a stretch in the front of your thigh and knee (quadriceps). 4. Hold this position for __________ seconds. Repeat __________ times. Complete this exercise __________ times a day. Hamstring, supine 1. Lie on your back (supine position). 2. Loop a belt or towel over the ball of your left / right foot. The ball of your foot is on the walking surface, right under your toes. 3. Straighten your left / right knee and slowly pull on the belt to raise your leg until you feel a gentle stretch behind your knee (hamstring). ? Do not let your knee bend while you do this. ? Keep your other leg flat on the floor. 4. Hold this position for __________ seconds. Repeat __________ times. Complete this exercise __________ times a day. Strengthening exercises These exercises build strength and endurance in your knee. Endurance is the ability to use your muscles for a long time, even after they get tired. Quadriceps, isometric This exercise stretches the muscles in front of your thigh (quadriceps) without moving your knee joint (  isometric). 1. Lie on your back with your left / right leg extended and your other knee bent. Put a rolled towel or small pillow under your knee if told by your health care provider. 2. Slowly tense the muscles in the front of your left / right thigh. You should see your kneecap slide up toward your hip or see increased dimpling just above the knee. This motion will push the back of the knee toward the  floor. 3. For __________ seconds, hold the muscle as tight as you can without increasing your pain. 4. Relax the muscles slowly and completely. Repeat __________ times. Complete this exercise __________ times a day. Straight leg raises This exercise stretches the muscles in front of your thigh (quadriceps) and the muscles that move your hips (hip flexors). 1. Lie on your back with your left / right leg extended and your other knee bent. 2. Tense the muscles in the front of your left / right thigh. You should see your kneecap slide up or see increased dimpling just above the knee. Your thigh may even shake a bit. 3. Keep these muscles tight as you raise your leg 4-6 inches (10-15 cm) off the floor. Do not let your knee bend. 4. Hold this position for __________ seconds. 5. Keep these muscles tense as you lower your leg. 6. Relax your muscles slowly and completely after each repetition. Repeat __________ times. Complete this exercise __________ times a day. Hamstring, isometric 1. Lie on your back on a firm surface. 2. Bend your left / right knee about __________ degrees. 3. Dig your left / right heel into the surface as if you are trying to pull it toward your buttocks. Tighten the muscles in the back of your thighs (hamstring) to "dig" as hard as you can without increasing any pain. 4. Hold this position for __________ seconds. 5. Release the tension gradually and allow your muscles to relax completely for __________ seconds after each repetition. Repeat __________ times. Complete this exercise __________ times a day. Hamstring curls If told by your health care provider, do this exercise while wearing ankle weights. Begin with __________ lb weights. Then increase the weight by 1 lb (0.5 kg) increments. Do not wear ankle weights that are more than __________ lb. 1. Lie on your abdomen with your legs straight. 2. Bend your left / right knee as far as you can without feeling pain. Keep your hips  flat against the floor. 3. Hold this position for __________ seconds. 4. Slowly lower your leg to the starting position. Repeat __________ times. Complete this exercise __________ times a day. Squats This exercise strengthens the muscles in front of your thigh and knee (quadriceps). 1. Stand in front of a table, with your feet and knees pointing straight ahead. You may rest your hands on the table for balance but not for support. 2. Slowly bend your knees and lower your hips like you are going to sit in a chair. ? Keep your weight over your heels, not over your toes. ? Keep your lower legs upright so they are parallel with the table legs. ? Do not let your hips go lower than your knees. ? Do not bend lower than told by your health care provider. ? If your knee pain increases, do not bend as low. 3. Hold the squat position for __________ seconds. 4. Slowly push with your legs to return to standing. Do not use your hands to pull yourself to standing. Repeat __________ times. Complete this exercise   __________ times a day. Wall slides This exercise strengthens the muscles in front of your thigh and knee (quadriceps). 1. Lean your back against a smooth wall or door, and walk your feet out 18-24 inches (46-61 cm) from it. 2. Place your feet hip-width apart. 3. Slowly slide down the wall or door until your knees bend __________ degrees. Keep your knees over your heels, not over your toes. Keep your knees in line with your hips. 4. Hold this position for __________ seconds. Repeat __________ times. Complete this exercise __________ times a day. Straight leg raises This exercise strengthens the muscles that rotate the leg at the hip and move it away from your body (hip abductors). 1. Lie on your side with your left / right leg in the top position. Lie so your head, shoulder, knee, and hip line up. You may bend your bottom knee to help you keep your balance. 2. Roll your hips slightly forward so your  hips are stacked directly over each other and your left / right knee is facing forward. 3. Leading with your heel, lift your top leg 4-6 inches (10-15 cm). You should feel the muscles in your outer hip lifting. ? Do not let your foot drift forward. ? Do not let your knee roll toward the ceiling. 4. Hold this position for __________ seconds. 5. Slowly return your leg to the starting position. 6. Let your muscles relax completely after each repetition. Repeat __________ times. Complete this exercise __________ times a day. Straight leg raises This exercise stretches the muscles that move your hips away from the front of the pelvis (hip extensors). 1. Lie on your abdomen on a firm surface. You can put a pillow under your hips if that is more comfortable. 2. Tense the muscles in your buttocks and lift your left / right leg about 4-6 inches (10-15 cm). Keep your knee straight as you lift your leg. 3. Hold this position for __________ seconds. 4. Slowly lower your leg to the starting position. 5. Let your leg relax completely after each repetition. Repeat __________ times. Complete this exercise __________ times a day. This information is not intended to replace advice given to you by your health care provider. Make sure you discuss any questions you have with your health care provider. Document Revised: 02/08/2018 Document Reviewed: 02/08/2018 Elsevier Patient Education  2020 [place] 65 Years and Older, Female Preventive care refers to lifestyle choices and visits with your health care provider that can promote health and wellness. This includes:  A yearly physical exam. This is also called an annual well check.  Regular dental and eye exams.  Immunizations.  Screening for certain conditions.  Healthy lifestyle choices, such as diet and exercise. What can I expect for my preventive care visit? Physical exam Your health care provider will check:  Height and  weight. These may be used to calculate body mass index (BMI), which is a measurement that tells if you are at a healthy weight.  Heart rate and blood pressure.  Your skin for abnormal spots. Counseling Your health care provider may ask you questions about:  Alcohol, tobacco, and drug use.  Emotional well-being.  Home and relationship well-being.  Sexual activity.  Eating habits.  History of falls.  Memory and ability to understand (cognition).  Work and work Statistician.  Pregnancy and menstrual history. What immunizations do I need?  Influenza (flu) vaccine  This is recommended every year. Tetanus, diphtheria, and pertussis (Tdap) vaccine  You may need a Td booster every 10 years. Varicella (chickenpox) vaccine  You may need this vaccine if you have not already been vaccinated. Zoster (shingles) vaccine  You may need this after age 60. Pneumococcal conjugate (PCV13) vaccine  One dose is recommended after age 67. Pneumococcal polysaccharide (PPSV23) vaccine  One dose is recommended after age 67. Measles, mumps, and rubella (MMR) vaccine  You may need at least one dose of MMR if you were born in 1957 or later. You may also need a second dose. Meningococcal conjugate (MenACWY) vaccine  You may need this if you have certain conditions. Hepatitis A vaccine  You may need this if you have certain conditions or if you travel or work in places where you may be exposed to hepatitis A. Hepatitis B vaccine  You may need this if you have certain conditions or if you travel or work in places where you may be exposed to hepatitis B. Haemophilus influenzae type b (Hib) vaccine  You may need this if you have certain conditions. You may receive vaccines as individual doses or as more than one vaccine together in one shot (combination vaccines). Talk with your health care provider about the risks and benefits of combination vaccines. What tests do I need? Blood  tests  Lipid and cholesterol levels. These may be checked every 5 years, or more frequently depending on your overall health.  Hepatitis C test.  Hepatitis B test. Screening  Lung cancer screening. You may have this screening every year starting at age 55 if you have a 30-pack-year history of smoking and currently smoke or have quit within the past 15 years.  Colorectal cancer screening. All adults should have this screening starting at age 50 and continuing until age 75. Your health care provider may recommend screening at age 45 if you are at increased risk. You will have tests every 1-10 years, depending on your results and the type of screening test.  Diabetes screening. This is done by checking your blood sugar (glucose) after you have not eaten for a while (fasting). You may have this done every 1-3 years.  Mammogram. This may be done every 1-2 years. Talk with your health care provider about how often you should have regular mammograms.  BRCA-related cancer screening. This may be done if you have a family history of breast, ovarian, tubal, or peritoneal cancers. Other tests  Sexually transmitted disease (STD) testing.  Bone density scan. This is done to screen for osteoporosis. You may have this done starting at age 67. Follow these instructions at home: Eating and drinking  Eat a diet that includes fresh fruits and vegetables, whole grains, lean protein, and low-fat dairy products. Limit your intake of foods with high amounts of sugar, saturated fats, and salt.  Take vitamin and mineral supplements as recommended by your health care provider.  Do not drink alcohol if your health care provider tells you not to drink.  If you drink alcohol: ? Limit how much you have to 0-1 drink a day. ? Be aware of how much alcohol is in your drink. In the [place] one drink equals one 12 oz bottle of beer (355 mL), one 5 oz glass of wine (148 mL), or one 1 oz glass of hard liquor (44  mL). Lifestyle  Take daily care of your teeth and gums.  Stay active. Exercise for at least 30 minutes on 5 or more days each week.  Do not use any products that contain nicotine or   tobacco, such as cigarettes, e-cigarettes, and chewing tobacco. If you need help quitting, ask your health care provider.  If you are sexually active, practice safe sex. Use a condom or other form of protection in order to prevent STIs (sexually transmitted infections).  Talk with your health care provider about taking a low-dose aspirin or statin. What's next?  Go to your health care provider once a year for a well check visit.  Ask your health care provider how often you should have your eyes and teeth checked.  Stay up to date on all vaccines. This information is not intended to replace advice given to you by your health care provider. Make sure you discuss any questions you have with your health care provider. Document Revised: 04/14/2018 Document Reviewed: 04/14/2018 Elsevier Patient Education  [place] DASH stands for "Dietary Approaches to Stop Hypertension." The DASH eating plan is a healthy eating plan that has been shown to reduce high blood pressure (hypertension). It may also reduce your risk for type 2 diabetes, heart disease, and stroke. The DASH eating plan may also help with weight loss. What are tips for following this plan?  General guidelines  Avoid eating more than 2,300 mg (milligrams) of salt (sodium) a day. If you have hypertension, you may need to reduce your sodium intake to 1,500 mg a day.  Limit alcohol intake to no more than 1 drink a day for nonpregnant women and 2 drinks a day for men. One drink equals 12 oz of beer, 5 oz of wine, or 1 oz of hard liquor.  Work with your health care provider to maintain a healthy body weight or to lose weight. Ask what an ideal weight is for you.  Get at least 30 minutes of exercise that causes your heart to  beat faster (aerobic exercise) most days of the week. Activities may include walking, swimming, or biking.  Work with your health care provider or diet and nutrition specialist (dietitian) to adjust your eating plan to your individual calorie needs. Reading food labels   Check food labels for the amount of sodium per serving. Choose foods with less than 5 percent of the Daily Value of sodium. Generally, foods with less than 300 mg of sodium per serving fit into this eating plan.  To find whole grains, look for the word "whole" as the first word in the ingredient list. Shopping  Buy products labeled as "low-sodium" or "no salt added."  Buy fresh foods. Avoid canned foods and premade or frozen meals. Cooking  Avoid adding salt when cooking. Use salt-free seasonings or herbs instead of table salt or sea salt. Check with your health care provider or pharmacist before using salt substitutes.  Do not fry foods. Cook foods using healthy methods such as baking, boiling, grilling, and broiling instead.  Cook with heart-healthy oils, such as olive, canola, soybean, or sunflower oil. Meal planning  Eat a balanced diet that includes: ? 5 or more servings of fruits and vegetables each day. At each meal, try to fill half of your plate with fruits and vegetables. ? Up to 6-8 servings of whole grains each day. ? Less than 6 oz of lean meat, poultry, or fish each day. A 3-oz serving of meat is about the same size as a deck of cards. One egg equals 1 oz. ? 2 servings of low-fat dairy each day. ? A serving of nuts, seeds, or beans 5 times each week. ? Heart-healthy fats.  Healthy fats called Omega-3 fatty acids are found in foods such as flaxseeds and coldwater fish, like sardines, salmon, and mackerel.  Limit how much you eat of the following: ? Canned or prepackaged foods. ? Food that is high in trans fat, such as fried foods. ? Food that is high in saturated fat, such as fatty meat. ? Sweets,  desserts, sugary drinks, and other foods with added sugar. ? Full-fat dairy products.  Do not salt foods before eating.  Try to eat at least 2 vegetarian meals each week.  Eat more home-cooked food and less restaurant, buffet, and fast food.  When eating at a restaurant, ask that your food be prepared with less salt or no salt, if possible. What foods are recommended? The items listed may not be a complete list. Talk with your dietitian about what dietary choices are best for you. Grains Whole-grain or whole-wheat bread. Whole-grain or whole-wheat pasta. Brown rice. Oatmeal. Quinoa. Bulgur. Whole-grain and low-sodium cereals. Pita bread. Low-fat, low-sodium crackers. Whole-wheat flour tortillas. Vegetables Fresh or frozen vegetables (raw, steamed, roasted, or grilled). Low-sodium or reduced-sodium tomato and vegetable juice. Low-sodium or reduced-sodium tomato sauce and tomato paste. Low-sodium or reduced-sodium canned vegetables. Fruits All fresh, dried, or frozen fruit. Canned fruit in natural juice (without added sugar). Meat and other protein foods Skinless chicken or turkey. Ground chicken or turkey. Pork with fat trimmed off. Fish and seafood. Egg whites. Dried beans, peas, or lentils. Unsalted nuts, nut butters, and seeds. Unsalted canned beans. Lean cuts of beef with fat trimmed off. Low-sodium, lean deli meat. Dairy Low-fat (1%) or fat-free (skim) milk. Fat-free, low-fat, or reduced-fat cheeses. Nonfat, low-sodium ricotta or cottage cheese. Low-fat or nonfat yogurt. Low-fat, low-sodium cheese. Fats and oils Soft margarine without trans fats. Vegetable oil. Low-fat, reduced-fat, or light mayonnaise and salad dressings (reduced-sodium). Canola, safflower, olive, soybean, and sunflower oils. Avocado. Seasoning and other foods Herbs. Spices. Seasoning mixes without salt. Unsalted popcorn and pretzels. Fat-free sweets. What foods are not recommended? The items listed may not be a  complete list. Talk with your dietitian about what dietary choices are best for you. Grains Baked goods made with fat, such as croissants, muffins, or some breads. Dry pasta or rice meal packs. Vegetables Creamed or fried vegetables. Vegetables in a cheese sauce. Regular canned vegetables (not low-sodium or reduced-sodium). Regular canned tomato sauce and paste (not low-sodium or reduced-sodium). Regular tomato and vegetable juice (not low-sodium or reduced-sodium). Pickles. Olives. Fruits Canned fruit in a light or heavy syrup. Fried fruit. Fruit in cream or butter sauce. Meat and other protein foods Fatty cuts of meat. Ribs. Fried meat. Bacon. Sausage. Bologna and other processed lunch meats. Salami. Fatback. Hotdogs. Bratwurst. Salted nuts and seeds. Canned beans with added salt. Canned or smoked fish. Whole eggs or egg yolks. Chicken or turkey with skin. Dairy Whole or 2% milk, cream, and half-and-half. Whole or full-fat cream cheese. Whole-fat or sweetened yogurt. Full-fat cheese. Nondairy creamers. Whipped toppings. Processed cheese and cheese spreads. Fats and oils Butter. Stick margarine. Lard. Shortening. Ghee. Bacon fat. Tropical oils, such as coconut, palm kernel, or palm oil. Seasoning and other foods Salted popcorn and pretzels. Onion salt, garlic salt, seasoned salt, table salt, and sea salt. Worcestershire sauce. Tartar sauce. Barbecue sauce. Teriyaki sauce. Soy sauce, including reduced-sodium. Steak sauce. Canned and packaged gravies. Fish sauce. Oyster sauce. Cocktail sauce. Horseradish that you find on the shelf. Ketchup. Mustard. Meat flavorings and tenderizers. Bouillon cubes. Hot sauce and Tabasco sauce. Premade or   packaged marinades. Premade or packaged taco seasonings. Relishes. Regular salad dressings. Where to find more information:  National Heart, Lung, and Blood Institute: www.nhlbi.nih.gov  American Heart Association: www.heart.org Summary  The DASH eating plan is a  healthy eating plan that has been shown to reduce high blood pressure (hypertension). It may also reduce your risk for type 2 diabetes, heart disease, and stroke.  With the DASH eating plan, you should limit salt (sodium) intake to 2,300 mg a day. If you have hypertension, you may need to reduce your sodium intake to 1,500 mg a day.  When on the DASH eating plan, aim to eat more fresh fruits and vegetables, whole grains, lean proteins, low-fat dairy, and heart-healthy fats.  Work with your health care provider or diet and nutrition specialist (dietitian) to adjust your eating plan to your individual calorie needs. This information is not intended to replace advice given to you by your health care provider. Make sure you discuss any questions you have with your health care provider. Document Revised: 04/02/2017 Document Reviewed: 04/13/2016 Else[employer] Patient Education  2020 [employer]  

## 2020-02-14 ENCOUNTER — Encounter: Payer: Self-pay | Admitting: Family Medicine

## 2020-02-14 DIAGNOSIS — Z0001 Encounter for general adult medical examination with abnormal findings: Secondary | ICD-10-CM | POA: Diagnosis not present

## 2020-02-15 LAB — MAGNESIUM: Magnesium: 2.1 mg/dL (ref 1.6–2.3)

## 2020-02-15 LAB — VITAMIN D 25 HYDROXY (VIT D DEFICIENCY, FRACTURES): Vit D, 25-Hydroxy: 41.4 ng/mL (ref 30.0–100.0)

## 2020-02-21 LAB — HEMOGLOBIN A1C
Est. average glucose Bld gHb Est-mCnc: 114 mg/dL
Hgb A1c MFr Bld: 5.6 % (ref 4.8–5.6)

## 2020-02-21 LAB — LIPID PANEL
Chol/HDL Ratio: 2.6 ratio (ref 0.0–4.4)
Cholesterol, Total: 173 mg/dL (ref 100–199)
HDL: 67 mg/dL (ref >39)
LDL Chol Calc (NIH): 92 mg/dL (ref 0–99)
Triglycerides: 76 mg/dL (ref 0–149)
VLDL Cholesterol Cal: 14 mg/dL (ref 5–40)

## 2020-02-21 LAB — COMPREHENSIVE METABOLIC PANEL
ALT: 19 IU/L (ref 0–32)
AST: 17 IU/L (ref 0–40)
Albumin/Globulin Ratio: 1.7 (ref 1.2–2.2)
Albumin: 4.3 g/dL (ref 3.8–4.8)
Alkaline Phosphatase: 98 IU/L (ref 44–121)
BUN/Creatinine Ratio: 17 (ref 12–28)
BUN: 14 mg/dL (ref 8–27)
Bilirubin Total: 0.7 mg/dL (ref 0.0–1.2)
CO2: 23 mmol/L (ref 20–29)
Calcium: 9.2 mg/dL (ref 8.7–10.3)
Chloride: 103 mmol/L (ref 96–106)
Creatinine, Ser: 0.82 mg/dL (ref 0.57–1.00)
GFR calc Af Amer: 86 mL/min/{1.73_m2} (ref >59)
GFR calc non Af Amer: 74 mL/min/{1.73_m2} (ref >59)
Globulin, Total: 2.5 g/dL (ref 1.5–4.5)
Glucose: 102 mg/dL — ABNORMAL HIGH (ref 65–99)
Potassium: 4.2 mmol/L (ref 3.5–5.2)
Sodium: 139 mmol/L (ref 134–144)
Total Protein: 6.8 g/dL (ref 6.0–8.5)

## 2020-02-21 LAB — CBC WITH DIFFERENTIAL/PLATELET
Basophils Absolute: 0.1 10*3/uL (ref 0.0–0.2)
Basos: 1 %
EOS (ABSOLUTE): 0.1 10*3/uL (ref 0.0–0.4)
Eos: 2 %
Hematocrit: 43.7 % (ref 34.0–46.6)
Hemoglobin: 14.5 g/dL (ref 11.1–15.9)
Immature Grans (Abs): 0 10*3/uL (ref 0.0–0.1)
Immature Granulocytes: 0 %
Lymphocytes Absolute: 1.4 10*3/uL (ref 0.7–3.1)
Lymphs: 18 %
MCH: 30.7 pg (ref 26.6–33.0)
MCHC: 33.2 g/dL (ref 31.5–35.7)
MCV: 92 fL (ref 79–97)
Monocytes Absolute: 0.6 10*3/uL (ref 0.1–0.9)
Monocytes: 7 %
Neutrophils Absolute: 5.4 10*3/uL (ref 1.4–7.0)
Neutrophils: 72 %
Platelets: 261 10*3/uL (ref 150–450)
RBC: 4.73 x10E6/uL (ref 3.77–5.28)
RDW: 12.9 % (ref 11.7–15.4)
WBC: 7.6 10*3/uL (ref 3.4–10.8)

## 2020-02-21 LAB — CARDIOVASCULAR RISK ASSESSMENT

## 2020-02-21 LAB — THYROID PANEL WITH TSH
Free Thyroxine Index: 1.8 (ref 1.2–4.9)
T3 Uptake Ratio: 28 % (ref 24–39)
T4, Total: 6.3 ug/dL (ref 4.5–12.0)
TSH: 1.94 u[IU]/mL (ref 0.450–4.500)

## 2020-02-21 LAB — ZINC: Zinc: 94 ug/dL (ref 44–115)

## 2020-02-21 LAB — SARS-COV-2 ANTIBODIES: SARS-CoV-2 Antibodies: NEGATIVE

## 2020-02-21 LAB — VITAMIN C: Vitamin C: 1 mg/dL (ref 0.4–2.0)

## 2021-07-23 ENCOUNTER — Ambulatory Visit (INDEPENDENT_AMBULATORY_CARE_PROVIDER_SITE_OTHER): Payer: PPO | Admitting: Legal Medicine

## 2021-07-23 ENCOUNTER — Encounter: Payer: Self-pay | Admitting: Legal Medicine

## 2021-07-23 VITALS — BP 140/80 | HR 76 | Temp 97.6°F | Resp 15 | Ht 62.5 in | Wt 192.0 lb

## 2021-07-23 DIAGNOSIS — M25571 Pain in right ankle and joints of right foot: Secondary | ICD-10-CM | POA: Diagnosis not present

## 2021-07-23 DIAGNOSIS — M7989 Other specified soft tissue disorders: Secondary | ICD-10-CM | POA: Diagnosis not present

## 2021-07-23 DIAGNOSIS — I10 Essential (primary) hypertension: Secondary | ICD-10-CM | POA: Diagnosis not present

## 2021-07-23 DIAGNOSIS — M19071 Primary osteoarthritis, right ankle and foot: Secondary | ICD-10-CM | POA: Diagnosis not present

## 2021-07-23 DIAGNOSIS — Z0000 Encounter for general adult medical examination without abnormal findings: Secondary | ICD-10-CM | POA: Diagnosis not present

## 2021-07-23 DIAGNOSIS — T7840XA Allergy, unspecified, initial encounter: Secondary | ICD-10-CM

## 2021-07-23 LAB — POCT URINALYSIS DIP (CLINITEK)
Blood, UA: NEGATIVE
Glucose, UA: NEGATIVE mg/dL
Leukocytes, UA: NEGATIVE
Nitrite, UA: NEGATIVE
POC PROTEIN,UA: 30 — AB
Spec Grav, UA: >=1.03 — AB (ref 1.010–1.025)
Urobilinogen, UA: 0.2 E.U./dL
pH, UA: 5.5 (ref 5.0–8.0)

## 2021-07-23 NOTE — Progress Notes (Signed)
? ?Subjective:  ?Patient ID: Susan Holloway, female    DOB: April 10, 1951  Age: 71 y.o. MRN: [id number] ? ?Chief Complaint  ?Patient presents with  ? Annual Exam  ? Ankle Pain  ?  right  ? ? ?HPI Well Adult Physical: Patient here for a comprehensive physical exam.The patient reports with her right ankle ?Are you taking aspirin daily? no ? ?Encounter for general adult medical examination without abnormal findings  ?Physical ("At Risk" items are starred): Patient's last physical exam was 1 year ago .  ?Patient is not afflicted from Stress Incontinence and Urge Incontinence  ?Patient wears a seat belt, has smoke detectors, has carbon monoxide detectors, practices appropriate gun safety, and wears sunscreen with extended sun exposure. ?Dental Care: biannual cleanings, brushes and flosses daily. ?Ophthalmology/Optometry: Annual visit.  ?Hearing loss: none ?Vision impairments: none ? ? ?LMP: Menopause ?Pregnancy history: G2P2 ?Safe at home: yes ? ?She has not taking medicines. ? ?Right intermitent aching ankle pain since one year ago. It is coming and goes. ? ?Flowsheet Row Office Visit from 07/23/2021 in [facility]  ?PHQ-2 Total Score 5  ? ?  ?  ?  ?   ? ?  ?Social Hx   ?Social History  ? ?Socioeconomic History  ? Marital status: Divorced  ?  Spouse name: Not on file  ? Number of children: 2  ? Years of education: Not on file  ? Highest education level: Not on file  ?Occupational History  ? Occupation: Retired  ?Tobacco Use  ? Smoking status: Never  ? Smokeless tobacco: Never  ?Substance and Sexual Activity  ? Alcohol use: Yes  ?  Comment: twice a year  ? Drug use: Never  ? Sexual activity: Not on file  ?Other Topics Concern  ? Not on file  ?Social History Narrative  ? Not on file  ? ?Social Determinants of Health  ? ?Financial Resource Strain: Not on file  ?Food Insecurity: Not on file  ?Transportation Needs: Not on file  ?Physical Activity: Not on file  ?Stress: Not on file  ?Social Connections: Not on file   ? ?Past Medical History:  ?Diagnosis Date  ? Chronic fatigue   ? Primary insomnia   ? Vitamin D deficiency   ? ?Past Surgical History:  ?Procedure Laterality Date  ? COLON SURGERY  04/04/2010  ? double fistula    ? VENTRAL HERNIA REPAIR    ?  ?Family History  ?Problem Relation Age of Onset  ? Alzheimer's disease Mother   ? Parkinson's disease Father   ? Diabetes Maternal Grandmother   ? Prostate cancer Other   ? ? ?Review of Systems  ?Constitutional:  Negative for chills, fatigue and fever.  ?HENT:  Positive for congestion. Negative for ear pain and sore throat.   ?Eyes:  Negative for visual disturbance.  ?Respiratory:  Negative for cough and shortness of breath.   ?Cardiovascular:  Negative for chest pain and palpitations.  ?Gastrointestinal:  Negative for abdominal pain, constipation, diarrhea, nausea and vomiting.  ?Endocrine: Negative for polydipsia, polyphagia and polyuria.  ?Genitourinary:  Negative for difficulty urinating and dysuria.  ?Musculoskeletal:  Negative for arthralgias, back pain and myalgias.  ?Skin:  Negative for rash.  ?Neurological:  Negative for headaches.  ?Psychiatric/Behavioral:  Negative for dysphoric mood. The patient is not nervous/anxious.   ? ? ?Objective:  ?BP 140/80   Pulse 76   Temp 97.6 ?F (36.4 ?C)   Resp 15   Ht 5' 2.5" (1.588 m)  Wt 192 lb (87.1 kg)   SpO2 98%   BMI 34.56 kg/m?  ? ? ?  07/23/2021  ?  9:54 AM 02/13/2020  ?  9:15 AM 08/17/2019  ?  9:44 AM  ?BP/Weight  ?Systolic BP [id number]  ?Diastolic BP 80 72 54  ?Wt. (Lbs) 192 187 185  ?BMI 34.56 kg/m2 33.13 kg/m2 32.77 kg/m2  ? ? ?Physical Exam ?Vitals reviewed.  ?Constitutional:   ?   General: She is not in acute distress. ?   Appearance: Normal appearance.  ?HENT:  ?   Right Ear: Tympanic membrane normal.  ?   Left Ear: Tympanic membrane normal.  ?   Nose: Nose normal.  ?   Mouth/Throat:  ?   Mouth: Mucous membranes are moist.  ?   Pharynx: Oropharynx is clear.  ?Eyes:  ?   Extraocular Movements: Extraocular  movements intact.  ?   Conjunctiva/sclera: Conjunctivae normal.  ?   Pupils: Pupils are equal, round, and reactive to light.  ?Cardiovascular:  ?   Rate and Rhythm: Normal rate and regular rhythm.  ?   Pulses: Normal pulses.  ?   Heart sounds: Normal heart sounds. No murmur heard. ?  No gallop.  ?Pulmonary:  ?   Effort: Pulmonary effort is normal. No respiratory distress.  ?   Breath sounds: Normal breath sounds. No wheezing.  ?Abdominal:  ?   General: Abdomen is flat. Bowel sounds are normal. There is no distension.  ?   Tenderness: There is no abdominal tenderness.  ?Musculoskeletal:     ?   General: Normal range of motion.  ?   Cervical back: Normal range of motion and neck supple.  ?   Right lower leg: No edema.  ?   Left lower leg: No edema.  ?Skin: ?   General: Skin is warm and dry.  ?   Capillary Refill: Capillary refill takes less than 2 seconds.  ?Neurological:  ?   General: No focal deficit present.  ?   Mental Status: She is alert and oriented to person, place, and time. Mental status is at baseline.  ?   Gait: Gait normal.  ?   Deep Tendon Reflexes: Reflexes normal.  ? ? ?Lab Results  ?Component Value Date  ? WBC 7.6 02/14/2020  ? HGB 14.5 02/14/2020  ? HCT 43.7 02/14/2020  ? PLT 261 02/14/2020  ? GLUCOSE 102 (H) 02/14/2020  ? CHOL 173 02/14/2020  ? TRIG 76 02/14/2020  ? HDL 67 02/14/2020  ? Dwight 92 02/14/2020  ? ALT 19 02/14/2020  ? AST 17 02/14/2020  ? NA 139 02/14/2020  ? K 4.2 02/14/2020  ? CL 103 02/14/2020  ? CREATININE 0.82 02/14/2020  ? BUN 14 02/14/2020  ? CO2 23 02/14/2020  ? TSH 1.940 02/14/2020  ? HGBA1C 5.6 02/14/2020  ? ? ? ? ?Assessment & Plan:  ? ?Problem List Items Addressed This Visit   ? ?  ? Cardiovascular and Mediastinum ?Routine general medical examination at a health care facility      ?Relevant Orders  ?CBC with Differential/Platelet  ?Comprehensive metabolic panel  ?TSH  ?POCT URINALYSIS DIP (CLINITEK) (Completed)  ?Hemoglobin A1c  ?Vitamin B12  ? ? ?  ? Essential hypertension  - Primary  ? Relevant Orders  ? Lipid panel ?An individual hypertension care plan was established and reinforced today.  The patient's status was assessed using clinical findings on exam and labs or diagnostic tests. The patient's success at meeting treatment goals  on disease specific evidence-based guidelines and found to be fair controlled. ?SELF MANAGEMENT: The patient and I together assessed ways to personally work towards obtaining the recommended goals. Patient refuses medicines ?RECOMMENDATIONS: avoid decongestants found in common cold remedies, decrease consumption of alcohol, perform routine monitoring of BP with home BP cuff, exercise, reduction of dietary salt, take medicines as prescribed, try not to miss doses and quit smoking.  Regular exercise and maintaining a healthy weight is needed.  Stress reduction may help. ?A CLINICAL SUMMARY including written plan identify barriers to care unique to individual due to social or financial issues.  We attempt to mutually creat solutions for individual and family understanding.   ? ?Other Visit Diagnoses   ? ? Allergy, initial encounter      ? Relevant Orders  ? Ambulatory referral to Allergy ?Patient wants allergy testing ?  ? Hypomagnesemia      ? Relevant Orders  ? Magnesium ?History of low magnesium evel ?  ?   ?   ?   ?   ?   ?   ?   ?   ? Primary osteoarthritis of right ankle      ? Relevant Orders  ? DG Ankle Complete Right ?Get x-ry ankle, patient does not believe in arthritis ? ?Patient refused all prophylactic immunizations  ? ?  ? ?  ? ?Body mass index is 34.56 kg/m?.  ? ?These are the goals we discussed: ? Goals   ?None ?  ?  ? ?This is a list of the screening recommended for you and due dates:  ?Health Maintenance  ?Topic Date Due  ? Flu Shot  08/01/2021*  ? COVID-19 Vaccine (1) 08/08/2021*  ? Zoster (Shingles) Vaccine (1 of 2) 10/23/2021*  ? Pneumonia Vaccine (1 - PCV) 07/24/2022*  ? Mammogram  07/24/2022*  ? DEXA scan (bone density measurement)   07/24/2022*  ? Colon Cancer Screening  07/24/2022*  ? Tetanus Vaccine  07/24/2022*  ? Hepatitis C Screening: USPSTF Recommendation to screen - Ages 18-79 yo.  07/24/2022*  ? HPV Vaccine  Aged Out  ?*Topic was postponed.

## 2021-07-24 LAB — CBC WITH DIFFERENTIAL/PLATELET
Basophils Absolute: 0 10*3/uL (ref 0.0–0.2)
Basos: 0 %
EOS (ABSOLUTE): 0.1 10*3/uL (ref 0.0–0.4)
Eos: 1 %
Hematocrit: 44.5 % (ref 34.0–46.6)
Hemoglobin: 15.1 g/dL (ref 11.1–15.9)
Immature Grans (Abs): 0 10*3/uL (ref 0.0–0.1)
Immature Granulocytes: 0 %
Lymphocytes Absolute: 1.5 10*3/uL (ref 0.7–3.1)
Lymphs: 16 %
MCH: 30.8 pg (ref 26.6–33.0)
MCHC: 33.9 g/dL (ref 31.5–35.7)
MCV: 91 fL (ref 79–97)
Monocytes Absolute: 0.7 10*3/uL (ref 0.1–0.9)
Monocytes: 7 %
Neutrophils Absolute: 6.9 10*3/uL (ref 1.4–7.0)
Neutrophils: 76 %
Platelets: 305 10*3/uL (ref 150–450)
RBC: 4.91 x10E6/uL (ref 3.77–5.28)
RDW: 12.8 % (ref 11.7–15.4)
WBC: 9.2 10*3/uL (ref 3.4–10.8)

## 2021-07-24 LAB — TSH: TSH: 1.09 u[IU]/mL (ref 0.450–4.500)

## 2021-07-24 LAB — COMPREHENSIVE METABOLIC PANEL
ALT: 24 IU/L (ref 0–32)
AST: 27 IU/L (ref 0–40)
Albumin/Globulin Ratio: 1.7 (ref 1.2–2.2)
Albumin: 4.5 g/dL (ref 3.8–4.8)
Alkaline Phosphatase: 108 IU/L (ref 44–121)
BUN/Creatinine Ratio: 13 (ref 12–28)
BUN: 13 mg/dL (ref 8–27)
Bilirubin Total: 0.8 mg/dL (ref 0.0–1.2)
CO2: 25 mmol/L (ref 20–29)
Calcium: 10.4 mg/dL — ABNORMAL HIGH (ref 8.7–10.3)
Chloride: 100 mmol/L (ref 96–106)
Creatinine, Ser: 0.98 mg/dL (ref 0.57–1.00)
Globulin, Total: 2.7 g/dL (ref 1.5–4.5)
Glucose: 101 mg/dL — ABNORMAL HIGH (ref 70–99)
Potassium: 4.4 mmol/L (ref 3.5–5.2)
Sodium: 140 mmol/L (ref 134–144)
Total Protein: 7.2 g/dL (ref 6.0–8.5)
eGFR: 63 mL/min/{1.73_m2} (ref >59)

## 2021-07-24 LAB — LIPID PANEL
Chol/HDL Ratio: 2.7 ratio (ref 0.0–4.4)
Cholesterol, Total: 189 mg/dL (ref 100–199)
HDL: 71 mg/dL (ref >39)
LDL Chol Calc (NIH): 102 mg/dL — ABNORMAL HIGH (ref 0–99)
Triglycerides: 89 mg/dL (ref 0–149)
VLDL Cholesterol Cal: 16 mg/dL (ref 5–40)

## 2021-07-24 LAB — CARDIOVASCULAR RISK ASSESSMENT

## 2021-07-24 LAB — MAGNESIUM: Magnesium: 2.1 mg/dL (ref 1.6–2.3)

## 2021-07-24 LAB — HEMOGLOBIN A1C
Est. average glucose Bld gHb Est-mCnc: 123 mg/dL
Hgb A1c MFr Bld: 5.9 % — ABNORMAL HIGH (ref 4.8–5.6)

## 2021-07-24 LAB — VITAMIN B12: Vitamin B-12: >2000 pg/mL — ABNORMAL HIGH (ref 232–1245)

## 2021-07-24 NOTE — Progress Notes (Signed)
A1c 5.9 prediabetes, needs instruction on diet, B12 > 2000 high, glucose 101, calcium high 10.4, stop calcium supplements and vitamin d, liver tests normal, Cholesterol 102 high, suggest DASH diet, CBC normal, TSH 1.090 normal, Magnesium 2.1 normal,  ?lp

## 2021-10-09 ENCOUNTER — Ambulatory Visit: Payer: PPO | Admitting: Allergy and Immunology

## 2021-10-09 ENCOUNTER — Encounter: Payer: Self-pay | Admitting: Allergy and Immunology

## 2021-10-09 VITALS — HR 100 | Resp 16 | Ht 63.0 in | Wt 198.3 lb

## 2021-10-09 DIAGNOSIS — J3089 Other allergic rhinitis: Secondary | ICD-10-CM

## 2021-10-09 DIAGNOSIS — K219 Gastro-esophageal reflux disease without esophagitis: Secondary | ICD-10-CM | POA: Diagnosis not present

## 2021-10-09 NOTE — Patient Instructions (Addendum)
  1.  Allergen avoidance measures  2.  Treat and prevent inflammation:  A. OTC Nasacort - 1-2 sprays each nostril 3-7 times per week (takes days to work)  3.  Treat and prevent reflux/LPR:  A. Minimize caffeine consumption (coffee / tea / soda / chocolate) B.  Replace throat clearing with swallowing/drinking maneuver  4. Return to clinic in 4 weeks or earlier if problem  5. Immunotherapy???

## 2021-10-09 NOTE — Progress Notes (Signed)
[place] - [place] - [place] - [place] - [place]   Dear Henrene Pastor,  Thank you for referring Carson Myrtle to the [facility] on 10/09/2021.   Below is a summation of this patient's evaluation and recommendations.  Thank you for your referral. I will keep you informed about this patient's response to treatment.   If you have any questions please do not hesitate to contact me.   Sincerely,  Jiles Prows, MD Allergy / Immunology [place]   ______________________________________________________________________    NEW PATIENT NOTE  Referring Provider: Lillard Anes Primary Provider: Rochel Brome, MD Date of office visit: 10/09/2021    Subjective:   Chief Complaint:  Susan Holloway (DOB: 1950-08-09) is a 71 y.o. female who presents to the clinic on 10/09/2021 with a chief complaint of Allergies .     HPI: Markitta presents to this clinic in evaluation of allergies.    Her allergies present as stuffiness and congestion in her nose and occasional sneezing and intermittent anosmia and allergic shiners.  She thinks that she is little bit worse this year than she has been for many years.  She does not really use any medications because illogically she is opposed to using medications.  There is not really an obvious provoking factor giving rise to this issue.  Her allergy is also present as constant throat clearing and a cough and slime in her throat and inability to clear out her throat.  Certainly this area as she was much worse whenever she contracts a head cold but she has this issue all the time.  She has this mid chest discomfort occasionally that is developed over the course of the past year that she calls "heartburn".  She drinks 2 coffees in the morning and she is presently consuming chocolate on a daily basis and intermittently has tea.  When she takes a B complex  vitamin she can really feel this reflux issue.  She thinks that she had an upper endoscopy performed about 12 years ago.  Past Medical History:  Diagnosis Date  . Chronic fatigue   . Primary insomnia   . Vitamin D deficiency     Past Surgical History:  Procedure Laterality Date  . COLON SURGERY  04/04/2010  . double fistula    . VENTRAL HERNIA REPAIR      Allergies as of 10/09/2021       Reactions   Ciprofloxacin Itching   Levofloxacin Other (See Comments)   itching   Metronidazole Other (See Comments)   itching        Medication List   none  Review of systems negative except as noted in HPI / PMHx or noted below:  Review of Systems  Constitutional: Negative.   HENT: Negative.    Eyes: Negative.   Respiratory: Negative.    Cardiovascular: Negative.   Gastrointestinal: Negative.   Genitourinary: Negative.   Musculoskeletal: Negative.   Skin: Negative.   Neurological: Negative.   Endo/Heme/Allergies: Negative.   Psychiatric/Behavioral: Negative.      Family History  Problem Relation Age of Onset  . Alzheimer's disease Mother   . Parkinson's disease Father   . Diabetes Maternal Grandmother   . Prostate cancer Other     Social History   Socioeconomic History  . Marital status: Divorced    Spouse name: Not on file  . Number of children: 2  . Years of education:  Not on file  . Highest education level: Not on file  Occupational History  . Occupation: Retired  Tobacco Use  . Smoking status: Never  . Smokeless tobacco: Never  Substance and Sexual Activity  . Alcohol use: Yes    Comment: twice a year  . Drug use: Never  . Sexual activity: Not on file  Other Topics Concern  . Not on file  Social History Narrative  . Not on file   Social Determinants of Health   Financial Resource Strain: Not on file  Food Insecurity: Not on file  Transportation Needs: Not on file  Physical Activity: Not on file  Stress: Not on file  Social Connections: Not on  file  Intimate Partner Violence: Not on file    Environmental and Social history  Lives in a house with a dry environment, cats located inside the household, carpet in the bedroom, plastic on the bed, no plastic on the pillow, no smoking ongoing with inside the household.  Objective:  There were no vitals filed for this visit.      Physical Exam Constitutional:      Appearance: She is not diaphoretic.     Comments: Allergic shiners  HENT:     Head: Normocephalic.     Right Ear: Tympanic membrane, ear canal and external ear normal.     Left Ear: Tympanic membrane, ear canal and external ear normal.     Nose: Nose normal. No mucosal edema or rhinorrhea.     Mouth/Throat:     Pharynx: Uvula midline. No oropharyngeal exudate.  Eyes:     Conjunctiva/sclera: Conjunctivae normal.  Neck:     Thyroid: No thyromegaly.     Trachea: Trachea normal. No tracheal tenderness or tracheal deviation.  Cardiovascular:     Rate and Rhythm: Normal rate and regular rhythm.     Heart sounds: Normal heart sounds, S1 normal and S2 normal. No murmur heard. Pulmonary:     Effort: No respiratory distress.     Breath sounds: Normal breath sounds. No stridor. No wheezing or rales.  Lymphadenopathy:     Head:     Right side of head: No tonsillar adenopathy.     Left side of head: No tonsillar adenopathy.     Cervical: No cervical adenopathy.  Skin:    Findings: No erythema or rash.     Nails: There is no clubbing.  Neurological:     Mental Status: She is alert.    Diagnostics: Allergy skin tests were performed.   Assessment and Plan:    No diagnosis found.  Patient Instructions   1.   2.   3.   4.   5.   6.   7.   8.  Jiles Prows, MD Allergy / Immunology [facility]

## 2021-10-13 ENCOUNTER — Encounter: Payer: Self-pay | Admitting: Allergy and Immunology

## 2021-11-06 ENCOUNTER — Ambulatory Visit: Payer: PPO | Admitting: Allergy and Immunology

## 2021-12-30 DIAGNOSIS — L309 Dermatitis, unspecified: Secondary | ICD-10-CM | POA: Diagnosis not present

## 2022-01-22 LAB — HM DIABETES EYE EXAM

## 2022-01-22 NOTE — Progress Notes (Signed)
Patient presented today for Bp screening. Bp reading was taken on Left Lower arm and elevated. It was recommened for patient to follow up with her PCP

## 2022-01-27 ENCOUNTER — Encounter: Payer: Self-pay | Admitting: *Deleted

## 2022-01-27 ENCOUNTER — Ambulatory Visit: Payer: PPO | Attending: Family Medicine

## 2022-01-27 ENCOUNTER — Ambulatory Visit (INDEPENDENT_AMBULATORY_CARE_PROVIDER_SITE_OTHER): Payer: PPO | Admitting: Family Medicine

## 2022-01-27 VITALS — BP 148/72 | HR 75 | Temp 97.2°F | Resp 15 | Ht 62.5 in | Wt 188.0 lb

## 2022-01-27 DIAGNOSIS — R5383 Other fatigue: Secondary | ICD-10-CM

## 2022-01-27 DIAGNOSIS — I493 Ventricular premature depolarization: Secondary | ICD-10-CM | POA: Diagnosis not present

## 2022-01-27 DIAGNOSIS — R030 Elevated blood-pressure reading, without diagnosis of hypertension: Secondary | ICD-10-CM | POA: Diagnosis not present

## 2022-01-27 DIAGNOSIS — R42 Dizziness and giddiness: Secondary | ICD-10-CM

## 2022-01-27 DIAGNOSIS — M62838 Other muscle spasm: Secondary | ICD-10-CM | POA: Diagnosis not present

## 2022-01-27 DIAGNOSIS — R7303 Prediabetes: Secondary | ICD-10-CM | POA: Diagnosis not present

## 2022-01-27 LAB — PULMONARY FUNCTION TEST
FEV1/FVC: 65.2 %
FEV1: 1.58 L
FVC: 2.03 L

## 2022-01-27 NOTE — Progress Notes (Unsigned)
Acute Office Visit  Subjective:    Patient ID: Susan Holloway, female    DOB: [birth year], 71 y.o.   MRN: [id number]  Chief Complaint  Patient presents with   Fatigue   Shortness of Breath    HPI: Patient is in today for dypsnea, fatigue, nasal congestion x months.  Patient saw allergist. Told she had no allergies.  Has an exaggerated response to mosquito bites. Local swelling.   Past Medical History:  Diagnosis Date   Chronic fatigue    Primary insomnia    Vitamin D deficiency     Past Surgical History:  Procedure Laterality Date   COLON SURGERY  04/04/2010   double fistula     VENTRAL HERNIA REPAIR      Family History  Problem Relation Age of Onset   Alzheimer's disease Mother    Parkinson's disease Father    Diabetes Maternal Grandmother    Prostate cancer Other     Social History   Socioeconomic History   Marital status: Divorced    Spouse name: Not on file   Number of children: 2   Years of education: Not on file   Highest education level: Not on file  Occupational History   Occupation: Retired  Tobacco Use   Smoking status: Never   Smokeless tobacco: Never  Substance and Sexual Activity   Alcohol use: Yes    Comment: twice a year   Drug use: Never   Sexual activity: Not on file  Other Topics Concern   Not on file  Social History Narrative   Not on file   Social Determinants of Health   Financial Resource Strain: Not on file  Food Insecurity: Not on file  Transportation Needs: Not on file  Physical Activity: Not on file  Stress: Not on file  Social Connections: Not on file  Intimate Partner Violence: Not on file    No outpatient medications prior to visit.   No facility-administered medications prior to visit.    Allergies  Allergen Reactions   Ciprofloxacin Itching   Levofloxacin Other (See Comments)    itching Other reaction(s): Other (See Comments) itching itching   Metronidazole Other (See Comments)    itching Other  reaction(s): Other (See Comments) itching itching    Review of Systems  Constitutional:  Positive for fatigue. Negative for chills and fever.  HENT:  Positive for congestion. Negative for ear pain and sore throat.   Respiratory:  Positive for shortness of breath. Negative for cough.   Cardiovascular:  Negative for chest pain.  Gastrointestinal:  Positive for constipation. Negative for abdominal pain, blood in stool, diarrhea, nausea and vomiting.  Genitourinary:  Negative for dysuria and urgency.  Musculoskeletal:  Negative for arthralgias and myalgias.  Skin:  Negative for rash.  Neurological:  Negative for dizziness and headaches.  Psychiatric/Behavioral:  Negative for dysphoric mood. The patient is not nervous/anxious.        Objective:    Physical Exam Vitals reviewed.  Constitutional:      Appearance: Normal appearance.  HENT:     Right Ear: Tympanic membrane, ear canal and external ear normal.     Left Ear: Tympanic membrane, ear canal and external ear normal.     Nose: Nose normal.     Mouth/Throat:     Pharynx: Oropharynx is clear. No oropharyngeal exudate or posterior oropharyngeal erythema.  Cardiovascular:     Rate and Rhythm: Normal rate. Rhythm irregular.     Heart sounds: Normal heart sounds.  No murmur heard. Pulmonary:     Effort: Pulmonary effort is normal. No respiratory distress.     Breath sounds: Normal breath sounds.  Lymphadenopathy:     Cervical: No cervical adenopathy.  Neurological:     Mental Status: She is alert and oriented to person, place, and time.  Psychiatric:        Mood and Affect: Mood normal.        Behavior: Behavior normal.     BP (!) 148/72   Pulse 75   Temp (!) 97.2 F (36.2 C)   Resp 15   Ht 5' 2.5" (1.588 m)   Wt 188 lb (85.3 kg)   SpO2 97%   BMI 33.84 kg/m  Wt Readings from Last 3 Encounters:  01/30/22 188 lb (85.3 kg)  10/09/21 198 lb 4.8 oz (89.9 kg)  07/23/21 192 lb (87.1 kg)    Health Maintenance Due   Topic Date Due   COVID-19 Vaccine (1) Never done   Zoster Vaccines- Shingrix (1 of 2) Never done   INFLUENZA VACCINE  Never done    There are no preventive care reminders to display for this patient.   Lab Results  Component Value Date   TSH 1.090 07/23/2021   Lab Results  Component Value Date   WBC 9.2 07/23/2021   HGB 15.1 07/23/2021   HCT 44.5 07/23/2021   MCV 91 07/23/2021   PLT 305 07/23/2021   Lab Results  Component Value Date   NA 140 07/23/2021   K 4.4 07/23/2021   CO2 25 07/23/2021   GLUCOSE 101 (H) 07/23/2021   BUN 13 07/23/2021   CREATININE 0.98 07/23/2021   BILITOT 0.8 07/23/2021   ALKPHOS 108 07/23/2021   AST 27 07/23/2021   ALT 24 07/23/2021   PROT 7.2 07/23/2021   ALBUMIN 4.5 07/23/2021   CALCIUM 10.4 (H) 07/23/2021   EGFR 63 07/23/2021   Lab Results  Component Value Date   CHOL 189 07/23/2021   Lab Results  Component Value Date   HDL 71 07/23/2021   Lab Results  Component Value Date   LDLCALC 102 (H) 07/23/2021   Lab Results  Component Value Date   TRIG 89 07/23/2021   Lab Results  Component Value Date   CHOLHDL 2.7 07/23/2021   Lab Results  Component Value Date   HGBA1C 5.9 (H) 07/23/2021       Assessment & Plan:   Problem List Items Addressed This Visit       Cardiovascular and Mediastinum   PVC (premature ventricular contraction)    Ordered EKG, Heart monitor, referral to Cardiologist. Check labs.      Relevant Orders   LONG TERM MONITOR (3-14 DAYS)   CBC with Differential/Platelet   Comprehensive metabolic panel   Hemoglobin A1c   VITAMIN D 25 Hydroxy (Vit-D Deficiency, Fractures)   B12 and Folate Panel   Magnesium   Zinc   Vitamin C   T4, free   TSH   EKG 12-Lead (Completed)   Ambulatory referral to Cardiology     Other   Other fatigue - Primary    Check labs. Order EKG.      Relevant Orders   CBC with Differential/Platelet   Comprehensive metabolic panel   Hemoglobin A1c   VITAMIN D 25 Hydroxy  (Vit-D Deficiency, Fractures)   B12 and Folate Panel   Magnesium   Zinc   Vitamin C   T4, free   TSH   Prediabetes    Hemoglobin A1c,  3 month avg of blood sugars, is in prediabetic range.  In order to prevent progression to diabetes, recommend low carb diet and regular exercise Check A1C.      Relevant Orders   CBC with Differential/Platelet   Comprehensive metabolic panel   Hemoglobin A1c   VITAMIN D 25 Hydroxy (Vit-D Deficiency, Fractures)   B12 and Folate Panel   Magnesium   Zinc   Vitamin C   T4, free   TSH   Muscle spasm    Check labs.      Relevant Orders   CBC with Differential/Platelet   Comprehensive metabolic panel   Hemoglobin A1c   VITAMIN D 25 Hydroxy (Vit-D Deficiency, Fractures)   B12 and Folate Panel   Magnesium   Zinc   Vitamin C   T4, free   TSH   Dizziness   Relevant Orders   LONG TERM MONITOR (3-14 DAYS)   Elevated BP without diagnosis of hypertension    Check labs.      Relevant Orders   CBC with Differential/Platelet   Comprehensive metabolic panel   Hemoglobin A1c   VITAMIN D 25 Hydroxy (Vit-D Deficiency, Fractures)   B12 and Folate Panel   Magnesium   Zinc   Vitamin C   T4, free   TSH   EKG 12-Lead (Completed)   No orders of the defined types were placed in this encounter.   Orders Placed This Encounter  Procedures   CBC with Differential/Platelet   Comprehensive metabolic panel   Hemoglobin A1c   VITAMIN D 25 Hydroxy (Vit-D Deficiency, Fractures)   B12 and Folate Panel   Magnesium   Zinc   Vitamin C   T4, free   TSH   Ambulatory referral to Cardiology   LONG TERM MONITOR (3-14 DAYS)   EKG 12-Lead   Follow-up: Return in about 6 weeks (around 03/10/2022) for chronic follow up.  An After Visit Summary was printed and given to the patient.  Rochel Brome, MD [facility] [phone]

## 2022-01-27 NOTE — Progress Notes (Unsigned)
Enrolled for [employer] to mail a ZIO XT long term holter monitor to the patients address on file.   Letter with instructions mailed to patient.  DOD to read. 

## 2022-01-30 DIAGNOSIS — R5383 Other fatigue: Secondary | ICD-10-CM | POA: Insufficient documentation

## 2022-01-30 DIAGNOSIS — R42 Dizziness and giddiness: Secondary | ICD-10-CM | POA: Insufficient documentation

## 2022-01-30 DIAGNOSIS — I493 Ventricular premature depolarization: Secondary | ICD-10-CM | POA: Insufficient documentation

## 2022-01-30 DIAGNOSIS — R7303 Prediabetes: Secondary | ICD-10-CM | POA: Insufficient documentation

## 2022-01-30 DIAGNOSIS — M62838 Other muscle spasm: Secondary | ICD-10-CM | POA: Insufficient documentation

## 2022-01-30 DIAGNOSIS — R030 Elevated blood-pressure reading, without diagnosis of hypertension: Secondary | ICD-10-CM | POA: Insufficient documentation

## 2022-01-30 NOTE — Assessment & Plan Note (Signed)
Check labs 

## 2022-01-30 NOTE — Assessment & Plan Note (Signed)
Ordered EKG, Heart monitor, referral to Cardiologist. Check labs.

## 2022-01-30 NOTE — Assessment & Plan Note (Signed)
Hemoglobin A1c, 3 month avg of blood sugars, is in prediabetic range.  In order to prevent progression to diabetes, recommend low carb diet and regular exercise Check A1C.

## 2022-01-30 NOTE — Assessment & Plan Note (Signed)
Check labs. Order EKG.

## 2022-01-31 ENCOUNTER — Encounter: Payer: Self-pay | Admitting: Family Medicine

## 2022-02-03 ENCOUNTER — Telehealth: Payer: Self-pay | Admitting: *Deleted

## 2022-02-03 ENCOUNTER — Telehealth: Payer: Self-pay | Admitting: Family Medicine

## 2022-02-03 LAB — COMPREHENSIVE METABOLIC PANEL
ALT: 22 IU/L (ref 0–32)
AST: 23 IU/L (ref 0–40)
Albumin/Globulin Ratio: 2 (ref 1.2–2.2)
Albumin: 4.6 g/dL (ref 3.9–4.9)
Alkaline Phosphatase: 98 IU/L (ref 44–121)
BUN/Creatinine Ratio: 13 (ref 12–28)
BUN: 13 mg/dL (ref 8–27)
Bilirubin Total: 0.8 mg/dL (ref 0.0–1.2)
CO2: 23 mmol/L (ref 20–29)
Calcium: 10.1 mg/dL (ref 8.7–10.3)
Chloride: 102 mmol/L (ref 96–106)
Creatinine, Ser: 0.97 mg/dL (ref 0.57–1.00)
Globulin, Total: 2.3 g/dL (ref 1.5–4.5)
Glucose: 85 mg/dL (ref 70–99)
Potassium: 4.6 mmol/L (ref 3.5–5.2)
Sodium: 140 mmol/L (ref 134–144)
Total Protein: 6.9 g/dL (ref 6.0–8.5)
eGFR: 63 mL/min/{1.73_m2} (ref >59)

## 2022-02-03 LAB — CBC WITH DIFFERENTIAL/PLATELET
Basophils Absolute: 0 10*3/uL (ref 0.0–0.2)
Basos: 0 %
EOS (ABSOLUTE): 0.2 10*3/uL (ref 0.0–0.4)
Eos: 2 %
Hematocrit: 42.2 % (ref 34.0–46.6)
Hemoglobin: 13.9 g/dL (ref 11.1–15.9)
Immature Grans (Abs): 0 10*3/uL (ref 0.0–0.1)
Immature Granulocytes: 0 %
Lymphocytes Absolute: 1.6 10*3/uL (ref 0.7–3.1)
Lymphs: 17 %
MCH: 30.5 pg (ref 26.6–33.0)
MCHC: 32.9 g/dL (ref 31.5–35.7)
MCV: 93 fL (ref 79–97)
Monocytes Absolute: 0.7 10*3/uL (ref 0.1–0.9)
Monocytes: 8 %
Neutrophils Absolute: 6.9 10*3/uL (ref 1.4–7.0)
Neutrophils: 73 %
Platelets: 279 10*3/uL (ref 150–450)
RBC: 4.55 x10E6/uL (ref 3.77–5.28)
RDW: 13 % (ref 11.7–15.4)
WBC: 9.6 10*3/uL (ref 3.4–10.8)

## 2022-02-03 LAB — TSH: TSH: 1.05 u[IU]/mL (ref 0.450–4.500)

## 2022-02-03 LAB — T4, FREE: Free T4: 1.35 ng/dL (ref 0.82–1.77)

## 2022-02-03 LAB — HEMOGLOBIN A1C
Est. average glucose Bld gHb Est-mCnc: 117 mg/dL
Hgb A1c MFr Bld: 5.7 % — ABNORMAL HIGH (ref 4.8–5.6)

## 2022-02-03 LAB — B12 AND FOLATE PANEL
Folate: 15.2 ng/mL (ref >3.0)
Vitamin B-12: 1767 pg/mL — ABNORMAL HIGH (ref 232–1245)

## 2022-02-03 LAB — VITAMIN D 25 HYDROXY (VIT D DEFICIENCY, FRACTURES): Vit D, 25-Hydroxy: 37.5 ng/mL (ref 30.0–100.0)

## 2022-02-03 LAB — MAGNESIUM: Magnesium: 2.1 mg/dL (ref 1.6–2.3)

## 2022-02-03 LAB — VITAMIN C: Vitamin C: 1 mg/dL (ref 0.4–2.0)

## 2022-02-03 LAB — ZINC: Zinc: 82 ug/dL (ref 44–115)

## 2022-02-03 NOTE — Telephone Encounter (Signed)
I received a message from [employer] at [place].  Susan Holloway out of pocket cost for her ZIO XT monitor would be $5.00. He states, "Our billing team tent a letter to the patient's home since the phone number we have on file for her is a landline.  For an amount this small, we typically text the patient the amount, but in this case we sent a physical letter." Susan Holloway will wait for the letter to arrive before she applies the monitor.

## 2022-02-03 NOTE — Telephone Encounter (Signed)
Patient called in wanting to know what her cost of the ZIO XT would be. Informed operator out of pocket quotes would come directly from [place]. Patient had stated [employer] told her she would need to contact the ordering provider, which was not a [facility] Provider. I contacted Linton Ham at [facility] and request he have someone from [facility] to call this patient and provide her with an estimated out of pocket quote. Of note, [employer] has been working with a new independent company who processes billing and quotes.

## 2022-02-03 NOTE — Telephone Encounter (Signed)
PT CALLED IN DEMENDING TO SPEAK WITH DR. COX I TOLD HER DR. COX IS SEEING PTS THAT I COULD GET HER TO A NURSE IF SHE COULD TELL ME BRIEFLY ON WHAT SHE HAS CONCERNS ABOUT. SHE STATED SHE DOESN'T WANT TO TALK TO A NURSE - SHE WANTS TO SPEAK WITH DR. COX. I TOLD HER AGAIN THAT SHE IS SEEING PT THAT I COULD GIVE HER MESSAGE AND I ASKED AGAIN WHAT SHE HAD QUESTIONS ABOUT. SHE STATED ITS THE HEART MONITOR THAT DR. COX SUGGESTED SHE HAS LOOKED ALL OVER FROM $4 TO $500 AND SHE DOESN'T KNOW WHAT SHE IS DOING. SHE NEEDS HELP.

## 2022-02-05 NOTE — Telephone Encounter (Signed)
Patient will call to make an appointment after she sees Cardiology. She has an appointment on 02/13/2022

## 2022-02-13 ENCOUNTER — Ambulatory Visit: Payer: PPO | Admitting: Cardiology

## 2022-02-18 DIAGNOSIS — I493 Ventricular premature depolarization: Secondary | ICD-10-CM | POA: Diagnosis not present

## 2022-02-18 DIAGNOSIS — R42 Dizziness and giddiness: Secondary | ICD-10-CM | POA: Diagnosis not present

## 2022-02-19 ENCOUNTER — Encounter: Payer: Self-pay | Admitting: *Deleted

## 2022-02-19 NOTE — Progress Notes (Signed)
Patient ID: Susan Holloway, female   DOB: [birth year], 71 y.o.   MRN: [id number] Results from patient's 14 day ZIO XT patch monitor were posted today.  Patient wore patch 1 day 17 hours of 14 day enrollment.  There was no note in Epic to explain why.  I contacted our [employer] representative who reported patient had called [employer] stating she had severe irritation and blisters from the patch.  She was told if it was unbearable, to remove the patch and mail it back in for processing.  Results will be forward to a cardiologist for review.

## 2022-02-19 NOTE — Addendum Note (Signed)
Addended by: Thompson Caul I on: 02/19/2022 11:48 AM   Modules accepted: Orders

## 2022-03-11 ENCOUNTER — Ambulatory Visit: Payer: PPO | Admitting: Family Medicine

## 2022-03-13 ENCOUNTER — Encounter: Payer: Self-pay | Admitting: Family Medicine

## 2022-03-20 DIAGNOSIS — E559 Vitamin D deficiency, unspecified: Secondary | ICD-10-CM | POA: Insufficient documentation

## 2022-03-20 DIAGNOSIS — F5101 Primary insomnia: Secondary | ICD-10-CM | POA: Insufficient documentation

## 2022-03-23 ENCOUNTER — Encounter: Payer: Self-pay | Admitting: Cardiology

## 2022-03-23 ENCOUNTER — Ambulatory Visit: Payer: PPO | Attending: Cardiology | Admitting: Cardiology

## 2022-03-23 VITALS — BP 160/98 | HR 56 | Ht 62.5 in | Wt 189.0 lb

## 2022-03-23 DIAGNOSIS — I493 Ventricular premature depolarization: Secondary | ICD-10-CM | POA: Diagnosis not present

## 2022-03-23 DIAGNOSIS — R0602 Shortness of breath: Secondary | ICD-10-CM

## 2022-03-23 NOTE — Progress Notes (Signed)
Cardiology Office Note:    Date:  03/23/2022   ID:  Susan Holloway, DOB [birth year], MRN [id number]  PCP:  Blane Ohara, MD  Cardiologist:  Norman Herrlich, MD    Referring MD: Blane Ohara, MD    ASSESSMENT:    1. Frequent PVCs   2. SOB (shortness of breath)    PLAN:    In order of problems listed above:  She has frequent PVCs her twelve-lead EKG suggest RV outflow track origin I reviewed with her she needs evaluation including echocardiogram to screen for cardiomyopathy which shortness of breath and options would include beta-blocker calcium channel blocker EP catheter ablation.  She declines medications today.  I will see her back in the office after the echocardiogram we will need to watch her blood pressure closely.  She may require antihypertensive therapy   Next appointment: 6 weeks   Medication Adjustments/Labs and Tests Ordered: Current medicines are reviewed at length with the patient today.  Concerns regarding medicines are outlined above.  No orders of the defined types were placed in this encounter.  No orders of the defined types were placed in this encounter.      History of Present Illness:    Susan Holloway is a 71 y.o. female with a hx of free 1 PVCs on a ZIO monitor referred by Dr. Sedalia Muta  Recent event monitor with a PVC burden of 19%: Her symptomatic events were all fine with bigeminy. Study Highlights      Patient had a minimum heart rate of 66 bpm, maximum heart rate of 113 bpm, and average heart rate of 87 bpm.   Predominant underlying rhythm was sinus rhythm.   Isolated PACs were rare (<1.0%).   Isolated PVCs were frequent (19%).   Triggered and diary events associated with sinus rhythm, PACs, and PVCs.  Compliance with diet, lifestyle and medications: Yes  Her daughter is a cardiology nurse in [place] is concerned about her mother's high frequency of arrhythmia and prompted her that she would need a cardiac echo  I agree for 2 reasons 1 is  to look for cardiomyopathy associated with frequent PVCs and the other is for evaluation of her shortness of breath She has had for about 6 to 8 months she is short of breath walking a longer distance outdoors or doing heavy housework no edema orthopnea chest pain little or no palpitation and no syncope She is under a great deal of family stress with the daughter There is no family history of arrhythmia or sudden death She takes no over-the-counter proarrhythmic drugs Her primary care physician 1 put her on a beta-blocker and she declined medical treatment Past Medical History:  Diagnosis Date   Chronic fatigue    Primary insomnia    Vitamin D deficiency     Past Surgical History:  Procedure Laterality Date   COLON SURGERY  04/04/2010   double fistula     VENTRAL HERNIA REPAIR      Current Medications: Current Meds  Medication Sig   Ascorbic Acid (VITAMIN C) 1000 MG tablet Take 1,000 mg by mouth daily.   cholecalciferol (VITAMIN D3) 25 MCG (1000 UNIT) tablet Take 1,000 Units by mouth daily.   cyanocobalamin (VITAMIN B12) 1000 MCG tablet Take 1,000 mcg by mouth daily.   magnesium oxide (MAG-OX) 400 (240 Mg) MG tablet Take 400 mg by mouth daily.     Allergies:   Ciprofloxacin, Levofloxacin, and Metronidazole   Social History   Socioeconomic History   Marital  status: Divorced    Spouse name: Not on file   Number of children: 2   Years of education: Not on file   Highest education level: Not on file  Occupational History   Occupation: Retired  Tobacco Use   Smoking status: Never   Smokeless tobacco: Never  Vaping Use   Vaping Use: Never used  Substance and Sexual Activity   Alcohol use: Yes    Comment: twice a year   Drug use: Never   Sexual activity: Not on file  Other Topics Concern   Not on file  Social History Narrative   Not on file   Social Determinants of Health   Financial Resource Strain: Not on file  Food Insecurity: Not on file  Transportation Needs:  Not on file  Physical Activity: Not on file  Stress: Not on file  Social Connections: Not on file     Family History: The patient's family history includes Alzheimer's disease in her mother; Diabetes in her maternal grandmother; Parkinson's disease in her father; Prostate cancer in an other family member. ROS:   Please see the history of present illness.    All other systems reviewed and are negative.  EKGs/Labs/Other Studies Reviewed:    The following studies were reviewed today:  EKG: I reviewed the EKG at her PCP office showing sinus rhythm and RV outflow track morphology PVCs  Recent Labs: 01/27/2022: ALT 22; BUN 13; Creatinine, Ser 0.97; Hemoglobin 13.9; Magnesium 2.1; Platelets 279; Potassium 4.6; Sodium 140; TSH 1.050  Recent Lipid Panel    Component Value Date/Time   CHOL 189 07/23/2021 1042   TRIG 89 07/23/2021 1042   HDL 71 07/23/2021 1042   CHOLHDL 2.7 07/23/2021 1042   LDLCALC 102 (H) 07/23/2021 1042    Physical Exam:    VS:  BP (!) 160/98 (BP Location: Right Wrist, Patient Position: Sitting, Cuff Size: Normal) Comment (BP Location): Pt preference  Pulse (!) 56   Ht 5' 2.5" (1.588 m)   Wt 189 lb (85.7 kg)   SpO2 97%   BMI 34.02 kg/m     Wt Readings from Last 3 Encounters:  03/23/22 189 lb (85.7 kg)  01/30/22 188 lb (85.3 kg)  10/09/21 198 lb 4.8 oz (89.9 kg)    Blood pressure with an arm cuff was 166/80 GEN:  Well nourished, well developed in no acute distress HEENT: Normal NECK: No JVD; No carotid bruits LYMPHATICS: No lymphadenopathy CARDIAC: RRR, no murmurs, rubs, gallops RESPIRATORY:  Clear to auscultation without rales, wheezing or rhonchi  ABDOMEN: Soft, non-tender, non-distended MUSCULOSKELETAL:  No edema; No deformity  SKIN: Warm and dry NEUROLOGIC:  Alert and oriented x 3 PSYCHIATRIC:  Normal affect    Signed, Norman Herrlich, MD  03/23/2022 3:08 PM    [facility]

## 2022-03-23 NOTE — Patient Instructions (Signed)
Medication Instructions:  Your physician recommends that you continue on your current medications as directed. Please refer to the Current Medication list given to you today.  *If you need a refill on your cardiac medications before your next appointment, please call your pharmacy*   Lab Work: None If you have labs (blood work) drawn today and your tests are completely normal, you will receive your results only by: MyChart Message (if you have MyChart) OR A paper copy in the mail If you have any lab test that is abnormal or we need to change your treatment, we will call you to review the results.   Testing/Procedures: Your physician has requested that you have an echocardiogram. Echocardiography is a painless test that uses sound waves to create images of your heart. It provides your doctor with information about the size and shape of your heart and how well your heart's chambers and valves are working. This procedure takes approximately one hour. There are no restrictions for this procedure. Please do NOT wear cologne, perfume, aftershave, or lotions (deodorant is allowed). Please arrive 15 minutes prior to your appointment time.    Follow-Up: At [facility], you and your health needs are our priority.  As part of our continuing mission to provide you with exceptional heart care, we have created designated Provider Care Teams.  These Care Teams include your primary Cardiologist (physician) and Advanced Practice Providers (APPs -  Physician Assistants and Nurse Practitioners) who all work together to provide you with the care you need, when you need it.  We recommend signing up for the patient portal called "MyChart".  Sign up information is provided on this After Visit Summary.  MyChart is used to connect with patients for Virtual Visits (Telemedicine).  Patients are able to view lab/test results, encounter notes, upcoming appointments, etc.  Non-urgent messages can be sent to your  provider as well.   To learn more about what you can do with MyChart, go to [email or web address].    Your next appointment:   6 week(s)  The format for your next appointment:   In Person  Provider:   Norman Herrlich, MD    Other Instructions None  Important Information About Sugar

## 2022-03-25 ENCOUNTER — Telehealth: Payer: Self-pay | Admitting: Cardiology

## 2022-03-25 NOTE — Telephone Encounter (Signed)
Pt calling stating she spoke to someone at the front desk about a new PCP but she cannot remember who she spoke with or who they recommended. Please advise

## 2022-03-25 NOTE — Telephone Encounter (Signed)
Spoke with patient.

## 2022-04-09 ENCOUNTER — Ambulatory Visit: Payer: PPO | Attending: Cardiology

## 2022-04-09 DIAGNOSIS — I493 Ventricular premature depolarization: Secondary | ICD-10-CM

## 2022-04-09 DIAGNOSIS — R0602 Shortness of breath: Secondary | ICD-10-CM

## 2022-04-09 LAB — ECHOCARDIOGRAM COMPLETE
Area-P 1/2: 2.91 cm2
S' Lateral: 3.5 cm

## 2022-05-03 NOTE — Progress Notes (Unsigned)
Cardiology Office Note:    Date:  05/03/2022   ID:  Susan Holloway, DOB 12-Aug-1950, MRN [id number]  PCP:  Blane Ohara, MD  Cardiologist:  Norman Herrlich, MD    Referring MD: Blane Ohara, MD    ASSESSMENT:    No diagnosis found. PLAN:    In order of problems listed above:  ***   Next appointment: ***   Medication Adjustments/Labs and Tests Ordered: Current medicines are reviewed at length with the patient today.  Concerns regarding medicines are outlined above.  No orders of the defined types were placed in this encounter.  No orders of the defined types were placed in this encounter.   No chief complaint on file.   History of Present Illness:    Susan Holloway is a 71 y.o. female with a hx of frequent PVCs on ambulatory event monitor 19% burden symptoms associated with bigeminy and exertional shortness of breath last seen 03/23/2022 at the urging of her daughter who is a cardiology nurse in [place].  With evidence of cardiomyopathy and symptoms of exercise-induced shortness of breath I advised her to be seen by EP for consideration of PVC focus ablation she declined and also declined to take a beta-blocker.  With concern for cardiomyopathy she underwent an echocardiogram 04/09/2022 showing LV dysfunction EF 45 to 50% mildly reduced GLS -16.5. 1. Frequent PVC's      . Left ventricular ejection fraction, by estimation, is 45 to 50%. The  left ventricle has mildly decreased function. The left ventricle has no  regional wall motion abnormalities. Left ventricular diastolic parameters  are consistent with Grade I  diastolic dysfunction (impaired relaxation). The average left ventricular  global longitudinal strain is -16.5 %. The global longitudinal strain is  abnormal.   2. Right ventricular systolic function is normal. The right ventricular  size is normal. There is normal pulmonary artery systolic pressure.   3. The mitral valve is degenerative. Trivial mitral  valve regurgitation.  No evidence of mitral stenosis.   4. The aortic valve is tricuspid. Aortic valve regurgitation is not  visualized. No aortic stenosis is present.   5. The inferior vena cava is normal in size with greater than 50%  respiratory variability, suggesting right atrial pressure of 3 mmHg. Compliance with diet, lifestyle and medications: *** Past Medical History:  Diagnosis Date   Chronic fatigue    Primary insomnia    Vitamin D deficiency     Past Surgical History:  Procedure Laterality Date   COLON SURGERY  04/04/2010   double fistula     VENTRAL HERNIA REPAIR      Current Medications: No outpatient medications have been marked as taking for the 05/05/22 encounter (Appointment) with Baldo Daub, MD.     Allergies:   Ciprofloxacin, Levofloxacin, and Metronidazole   Social History   Socioeconomic History   Marital status: Divorced    Spouse name: Not on file   Number of children: 2   Years of education: Not on file   Highest education level: Not on file  Occupational History   Occupation: Retired  Tobacco Use   Smoking status: Never   Smokeless tobacco: Never  Vaping Use   Vaping Use: Never used  Substance and Sexual Activity   Alcohol use: Yes    Comment: twice a year   Drug use: Never   Sexual activity: Not on file  Other Topics Concern   Not on file  Social History Narrative   Not on file  Social Determinants of Health   Financial Resource Strain: Not on file  Food Insecurity: Not on file  Transportation Needs: Not on file  Physical Activity: Not on file  Stress: Not on file  Social Connections: Not on file     Family History: The patient's ***family history includes Alzheimer's disease in her mother; Diabetes in her maternal grandmother; Parkinson's disease in her father; Prostate cancer in an other family member. ROS:   Please see the history of present illness.    All other systems reviewed and are negative.  EKGs/Labs/Other  Studies Reviewed:    The following studies were reviewed today:  EKG:  EKG ordered today and personally reviewed.  The ekg ordered today demonstrates ***  Recent Labs: 01/27/2022: ALT 22; BUN 13; Creatinine, Ser 0.97; Hemoglobin 13.9; Magnesium 2.1; Platelets 279; Potassium 4.6; Sodium 140; TSH 1.050  Recent Lipid Panel    Component Value Date/Time   CHOL 189 07/23/2021 1042   TRIG 89 07/23/2021 1042   HDL 71 07/23/2021 1042   CHOLHDL 2.7 07/23/2021 1042   LDLCALC 102 (H) 07/23/2021 1042    Physical Exam:    VS:  There were no vitals taken for this visit.    Wt Readings from Last 3 Encounters:  03/23/22 189 lb (85.7 kg)  01/30/22 188 lb (85.3 kg)  10/09/21 198 lb 4.8 oz (89.9 kg)     GEN: *** Well nourished, well developed in no acute distress HEENT: Normal NECK: No JVD; No carotid bruits LYMPHATICS: No lymphadenopathy CARDIAC: ***RRR, no murmurs, rubs, gallops RESPIRATORY:  Clear to auscultation without rales, wheezing or rhonchi  ABDOMEN: Soft, non-tender, non-distended MUSCULOSKELETAL:  No edema; No deformity  SKIN: Warm and dry NEUROLOGIC:  Alert and oriented x 3 PSYCHIATRIC:  Normal affect    Signed, Norman Herrlich, MD  05/03/2022 9:43 AM    [facility]

## 2022-05-05 ENCOUNTER — Other Ambulatory Visit: Payer: Self-pay

## 2022-05-05 ENCOUNTER — Ambulatory Visit: Payer: PPO | Attending: Cardiology | Admitting: Cardiology

## 2022-05-05 ENCOUNTER — Encounter: Payer: Self-pay | Admitting: Cardiology

## 2022-05-05 VITALS — BP 171/69 | HR 79 | Ht 62.5 in | Wt 188.0 lb

## 2022-05-05 DIAGNOSIS — I493 Ventricular premature depolarization: Secondary | ICD-10-CM | POA: Diagnosis not present

## 2022-05-05 DIAGNOSIS — I428 Other cardiomyopathies: Secondary | ICD-10-CM

## 2022-05-05 NOTE — Patient Instructions (Signed)
Medication Instructions:  Your physician recommends that you continue on your current medications as directed. Please refer to the Current Medication list given to you today.  *If you need a refill on your cardiac medications before your next appointment, please call your pharmacy*   Lab Work: None If you have labs (blood work) drawn today and your tests are completely normal, you will receive your results only by: MyChart Message (if you have MyChart) OR A paper copy in the mail If you have any lab test that is abnormal or we need to change your treatment, we will call you to review the results.   Testing/Procedures: None   Follow-Up: At [facility], you and your health needs are our priority.  As part of our continuing mission to provide you with exceptional heart care, we have created designated Provider Care Teams.  These Care Teams include your primary Cardiologist (physician) and Advanced Practice Providers (APPs -  Physician Assistants and Nurse Practitioners) who all work together to provide you with the care you need, when you need it.  We recommend signing up for the patient portal called "MyChart".  Sign up information is provided on this After Visit Summary.  MyChart is used to connect with patients for Virtual Visits (Telemedicine).  Patients are able to view lab/test results, encounter notes, upcoming appointments, etc.  Non-urgent messages can be sent to your provider as well.   To learn more about what you can do with MyChart, go to [email or web address].    Your next appointment:   Follow up as needed  The format for your next appointment:   In Person  Provider:   Brian Munley, MD    Other Instructions None  Important Information About Sugar       

## 2022-05-22 ENCOUNTER — Encounter: Payer: Self-pay | Admitting: Cardiology

## 2022-07-06 ENCOUNTER — Telehealth: Payer: Self-pay | Admitting: *Deleted

## 2022-07-06 ENCOUNTER — Encounter: Payer: Self-pay | Admitting: Cardiology

## 2022-07-06 ENCOUNTER — Ambulatory Visit: Payer: PPO | Attending: Cardiology | Admitting: Cardiology

## 2022-07-06 VITALS — BP 138/84 | HR 74 | Ht 62.5 in | Wt 187.2 lb

## 2022-07-06 DIAGNOSIS — I493 Ventricular premature depolarization: Secondary | ICD-10-CM

## 2022-07-06 DIAGNOSIS — R0683 Snoring: Secondary | ICD-10-CM

## 2022-07-06 DIAGNOSIS — R400 Somnolence: Secondary | ICD-10-CM | POA: Diagnosis not present

## 2022-07-06 NOTE — Patient Instructions (Addendum)
Medication Instructions:  Your physician has recommended you make the following change in your medication:  CHANGE your Magnesium you are taking -- start taking Magnesium Taurate OTC (follow instructions on the bottle per Dr. Curt Bears)  *If you need a refill on your cardiac medications before your next appointment, please call your pharmacy*   Lab Work: None ordered   Testing/Procedures: Your physician has recommended that you have a sleep study. This test records several body functions during sleep, including: brain activity, eye movement, oxygen and carbon dioxide blood levels, heart rate and rhythm, breathing rate and rhythm, the flow of air through your mouth and nose, snoring, body muscle movements, and chest and belly movement.   Follow-Up: At [facility], you and your health needs are our priority.  As part of our continuing mission to provide you with exceptional heart care, we have created designated Provider Care Teams.  These Care Teams include your primary Cardiologist (physician) and Advanced Practice Providers (APPs -  Physician Assistants and Nurse Practitioners) who all work together to provide you with the care you need, when you need it.  Your next appointment:   3 month(s)  The format for your next appointment:   In Person  Provider:   Allegra Lai, MD    Thank you for choosing [facility]!!   Trinidad Curet, RN [phone]  Other Instructions  Sleep Study, Adult A sleep study (polysomnogram) is a series of tests done while you are sleeping. It is used to see how well you sleep, help diagnose a sleep disorder, or create a plan to treat your sleep disorder. Sleep studies are done at sleep centers, which may be inside a hospital, office, or clinic. Your health care provider may recommend a sleep study if you: Have brief periods in which you stop breathing during sleep (sleepapnea). Fall asleep suddenly during the day (narcolepsy). Have trouble falling  asleep or staying asleep (insomnia). Feel like you need to move your legs when trying to fall asleep (restless legs syndrome). Move your legs by flexing and extending them regularly while asleep (periodic limb movement disorder). Act out your dreams while you sleep (sleep behavior disorder). Feel like you cannot move when you first wake up (sleep paralysis). What tests are part of a sleep study? Most sleep studies record the following during sleep: Brain activity. Eye and limb movements. Heart rate and rhythm and blood pressure. Breathing rate and rhythm and blood oxygen level. Chest and belly movement as you breathe. Snoring or other noises. Body position. Tell a health care provider about: Any allergies you have, especially to products with sticky surfaces (adhesives). All medicines you are taking, including vitamins, herbs, eye drops, creams, and over-the-counter medicines. Any medical conditions you have. What happens before the test? Your health care provider will let you know if you should stop taking any of your regular medicines before the test. Bring your pajamas and toothbrush with you to the sleep study. Do not have caffeine on the day of your sleep study. Do not drink alcohol on the day of your sleep study. What happens during the test?     Most sleep studies are done during a normal period of time for a full night of sleep. You will arrive at the study center in the evening and go home in the morning. The room where you have the study may look like a hospital room or a hotel room. For the test: Round, sticky patches with sensors attached to recording wires (electrodes)  will be placed on your scalp, face, chest, and limbs. Wires from all the electrodes and sensors will run from your bed to a computer. The wires can be taken off and put back on if you need to get out of bed to go to the bathroom. A sensor will be placed over your nose to measure airflow. A finger clip will  be put on your finger or ear to measure your blood oxygen level (pulse oximetry). Belts will be placed around your belly and chest to measure breathing movements. Monitoring will begin. The health care providers doing the study may come in and out of the room during the study. Most of the time, they will be in another room monitoring your test as you sleep. If you have signs of sleep apnea during your test, you may get a treatment mask to wear for the second half of the night. The mask provides positive airway pressure (PAP) to help you breathe better during sleep. This may greatly improve your sleep apnea. You will then have all tests done again with the mask in place to see if your measurements and recordings change. What can I expect after the test? A health care provider who specializes in sleep will evaluate the results of your sleep study and share them with you and your primary health care provider. Based on your results, your medical history, and a physical exam, you may be diagnosed with a sleep disorder. Your health care team will help determine your treatment options based on your diagnosis. This may include: Improving your sleep habits (sleep hygiene). Wearing a continuous positive airway pressure (CPAP) or bi-level positive airway pressure (BIPAP) mask. Wearing an oral device at night to improve breathing and reduce snoring. Taking medicines. Follow these instructions at home: Take over-the-counter and prescription medicines only as told by your health care provider. Make any lifestyle changes that your health care provider recommends. If you are instructed to use a CPAP or BIPAP mask, make sure you use it nightly as directed. If you were given a device to open your airway while you sleep, use it only as told by your health care provider. Do not use any products that contain nicotine or tobacco. These products include cigarettes, chewing tobacco, and vaping devices, such as  e-cigarettes. If you need help quitting, ask your health care provider. Summary A sleep study is a series of tests done while you are sleeping. It shows how well you sleep. Most sleep studies are done over one full night of sleep. You will arrive at the study center in the evening and go home in the morning. If you have signs of the sleep disorder called sleep apnea during your test, you may get a treatment mask to wear for the second half of the night. A health care provider who specializes in sleep will evaluate the results of your sleep study and share them with your primary health care provider. This information is not intended to replace advice given to you by your health care provider. Make sure you discuss any questions you have with your health care provider. Document Revised: 08/20/2021 Document Reviewed: 08/20/2021 Elsevier Patient Education  [employer].

## 2022-07-06 NOTE — Telephone Encounter (Signed)
Prior Authorization for [employer] sent to [employer] via web portal. Approval Number [id number]. Valid dates 07/06/22 to 10/04/22.
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
## 2022-07-07 ENCOUNTER — Other Ambulatory Visit: Payer: Self-pay

## 2022-07-07 ENCOUNTER — Telehealth: Payer: Self-pay | Admitting: Cardiology

## 2022-07-07 NOTE — Telephone Encounter (Signed)
Pt c/o medication issue:  1. Name of Medication: Magnesium Taurate  2. How are you currently taking this medication (dosage and times per day)?   3. Are you having a reaction (difficulty breathing--STAT)? No  4. What is your medication issue? Pt is requesting how much magnesium taurate she is supposed to start taking

## 2022-07-07 NOTE — Telephone Encounter (Signed)
Pt aware I am going to further discuss this w/ MD when he returns to the office.   Informed that he advised she take what bottle recommends, but she reports that are different ones w/ different dosing so she is not sure. Aware will follow up after MD reviews.

## 2022-07-08 NOTE — Telephone Encounter (Signed)
Reviewed w/ Dr. Curt Bears. Pt informed that I just sent 2 options via mychart. She will take a look and contact me back if she does not think those will work  (she has gi issues and absorption issues) She appreciates my call

## 2022-07-08 NOTE — Telephone Encounter (Signed)
Pt calling back for an update

## 2022-08-10 ENCOUNTER — Telehealth: Payer: Self-pay | Admitting: Cardiology

## 2022-08-10 NOTE — Telephone Encounter (Signed)
Pt cancelled 3 month f/u app in June stating it was not needed. FYI.

## 2022-09-21 ENCOUNTER — Telehealth: Payer: Self-pay

## 2022-09-21 ENCOUNTER — Other Ambulatory Visit: Payer: Self-pay

## 2022-09-21 NOTE — Telephone Encounter (Signed)
Patient came in office today with some concerns she was confused why she had vitamin d deficiency on her problem list, she was never told about it, also wanted to correct the right b12 she is taking and ask about making an appointment to get labs.  After going through patient chart it look like in 2015 she had a low vitamin d of 28. I explain to her that she has been on medication so it is therapeutic. Patient understood and her B12 was also change to the correct one she is taking and a copy of her labs was giving to her as well. Also explain to patient she is due for an appointment and she can call and schedule appointment and either asl to get her labs done before her appointment and go over at her appointment or she can get labs the day of her appointment.

## 2022-10-05 ENCOUNTER — Ambulatory Visit: Payer: PPO | Admitting: Cardiology

## 2022-12-31 DIAGNOSIS — Z131 Encounter for screening for diabetes mellitus: Secondary | ICD-10-CM | POA: Diagnosis not present

## 2023-04-05 ENCOUNTER — Encounter: Payer: PPO | Admitting: Family Medicine

## 2023-04-08 DIAGNOSIS — W19XXXA Unspecified fall, initial encounter: Secondary | ICD-10-CM | POA: Diagnosis not present

## 2023-04-08 DIAGNOSIS — S0003XA Contusion of scalp, initial encounter: Secondary | ICD-10-CM | POA: Diagnosis not present

## 2023-04-08 DIAGNOSIS — S0990XA Unspecified injury of head, initial encounter: Secondary | ICD-10-CM | POA: Diagnosis not present
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
## 2023-04-19 ENCOUNTER — Encounter: Payer: Self-pay | Admitting: Family Medicine

## 2023-04-19 ENCOUNTER — Ambulatory Visit (INDEPENDENT_AMBULATORY_CARE_PROVIDER_SITE_OTHER): Payer: PPO | Admitting: Family Medicine

## 2023-04-19 VITALS — BP 144/98 | HR 61 | Temp 97.5°F | Ht 62.5 in | Wt 180.0 lb

## 2023-04-19 DIAGNOSIS — S0003XA Contusion of scalp, initial encounter: Secondary | ICD-10-CM | POA: Diagnosis not present

## 2023-04-19 DIAGNOSIS — Z1159 Encounter for screening for other viral diseases: Secondary | ICD-10-CM

## 2023-04-19 DIAGNOSIS — I10 Essential (primary) hypertension: Secondary | ICD-10-CM | POA: Diagnosis not present

## 2023-04-19 DIAGNOSIS — R42 Dizziness and giddiness: Secondary | ICD-10-CM

## 2023-04-19 DIAGNOSIS — Z13820 Encounter for screening for osteoporosis: Secondary | ICD-10-CM | POA: Diagnosis not present

## 2023-04-19 DIAGNOSIS — Z780 Asymptomatic menopausal state: Secondary | ICD-10-CM

## 2023-04-19 DIAGNOSIS — R7303 Prediabetes: Secondary | ICD-10-CM

## 2023-04-19 NOTE — Patient Instructions (Signed)
VISIT SUMMARY:  You visited [name] today for a follow-up after your fall two weeks ago, which resulted in a head injury. You have been experiencing dizziness, occasional headaches, and vertigo since the incident. Additionally, your blood pressure was found to be high during today's visit.  YOUR PLAN:  -HEAD TRAUMA: You experienced a head injury from a fall, resulting in a bump on your head and symptoms like dizziness and headaches. A CT scan showed no serious injury. We will continue to monitor your symptoms, and if you develop persistent headaches, confusion, or nausea, we may need to repeat the CT scan.  -HYPERTENSION: Hypertension means high blood pressure, which can increase the risk of heart disease and stroke. We discussed lifestyle changes such as a low salt diet, weight loss, and regular exercise, 8 hours of sleep per night, and decreasing stress. You should check your blood pressure at home regularly, and we will recheck it at your next visit. If it remains high, we may start you on medication.  INSTRUCTIONS:  Please monitor your blood pressure at home and keep a record of your readings. If you experience persistent headaches, confusion, or nausea, contact us immediately. We will recheck your blood pressure at your next visit and consider starting medication if it remains high.

## 2023-04-20 LAB — HEMOGLOBIN A1C
Est. average glucose Bld gHb Est-mCnc: 120 mg/dL
Hgb A1c MFr Bld: 5.8 % — ABNORMAL HIGH (ref 4.8–5.6)

## 2023-04-20 LAB — COMPREHENSIVE METABOLIC PANEL
ALT: 19 [IU]/L (ref 0–32)
AST: 21 [IU]/L (ref 0–40)
Albumin: 4.3 g/dL (ref 3.9–4.9)
Alkaline Phosphatase: 108 [IU]/L (ref 44–121)
BUN/Creatinine Ratio: 17 (ref 12–28)
BUN: 16 mg/dL (ref 8–27)
Bilirubin Total: 0.7 mg/dL (ref 0.0–1.2)
CO2: 25 mmol/L (ref 20–29)
Calcium: 9.7 mg/dL (ref 8.7–10.3)
Chloride: 98 mmol/L (ref 96–106)
Creatinine, Ser: 0.95 mg/dL (ref 0.57–1.00)
Globulin, Total: 2.7 g/dL (ref 1.5–4.5)
Glucose: 84 mg/dL (ref 70–99)
Potassium: 4.6 mmol/L (ref 3.5–5.2)
Sodium: 140 mmol/L (ref 134–144)
Total Protein: 7 g/dL (ref 6.0–8.5)
eGFR: 64 mL/min/{1.73_m2} (ref >59)

## 2023-04-20 LAB — LIPID PANEL
Chol/HDL Ratio: 2.8 {ratio} (ref 0.0–4.4)
Cholesterol, Total: 176 mg/dL (ref 100–199)
HDL: 64 mg/dL (ref >39)
LDL Chol Calc (NIH): 94 mg/dL (ref 0–99)
Triglycerides: 102 mg/dL (ref 0–149)
VLDL Cholesterol Cal: 18 mg/dL (ref 5–40)

## 2023-04-20 LAB — CBC WITH DIFFERENTIAL/PLATELET
Basophils Absolute: 0.1 10*3/uL (ref 0.0–0.2)
Basos: 1 %
EOS (ABSOLUTE): 0.2 10*3/uL (ref 0.0–0.4)
Eos: 3 %
Hematocrit: 41.9 % (ref 34.0–46.6)
Hemoglobin: 13.7 g/dL (ref 11.1–15.9)
Immature Grans (Abs): 0 10*3/uL (ref 0.0–0.1)
Immature Granulocytes: 0 %
Lymphocytes Absolute: 1.6 10*3/uL (ref 0.7–3.1)
Lymphs: 18 %
MCH: 30.2 pg (ref 26.6–33.0)
MCHC: 32.7 g/dL (ref 31.5–35.7)
MCV: 92 fL (ref 79–97)
Monocytes Absolute: 0.6 10*3/uL (ref 0.1–0.9)
Monocytes: 7 %
Neutrophils Absolute: 6.3 10*3/uL (ref 1.4–7.0)
Neutrophils: 71 %
Platelets: 326 10*3/uL (ref 150–450)
RBC: 4.54 x10E6/uL (ref 3.77–5.28)
RDW: 12.9 % (ref 11.7–15.4)
WBC: 8.8 10*3/uL (ref 3.4–10.8)

## 2023-04-20 LAB — PHOSPHORUS: Phosphorus: 3.2 mg/dL (ref 3.0–4.3)

## 2023-04-20 LAB — HCV AB W REFLEX TO QUANT PCR: HCV Ab: NONREACTIVE

## 2023-04-20 LAB — TSH: TSH: 1.13 u[IU]/mL (ref 0.450–4.500)

## 2023-04-20 LAB — MAGNESIUM: Magnesium: 2.2 mg/dL (ref 1.6–2.3)
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
## 2023-04-21 NOTE — Assessment & Plan Note (Signed)
Persistent elevated blood pressure readings, including today's visit. Patient is not currently on antihypertensive medication. Discussed lifestyle modifications and potential need for medication to prevent stroke or heart attack. -Check blood pressure at home regularly. -Consider lifestyle modifications including low salt diet, weight loss, and regular exercise. -Recheck blood pressure at next visit. Consider initiation of antihypertensive medication if still elevated.

## 2023-04-21 NOTE — Assessment & Plan Note (Signed)
New onset since the fall. No nystagmus or other signs of central vertigo on examination. -Continue monitoring symptoms. If persistent or worsening, consider referral to ENT or neurology.

## 2023-04-26 ENCOUNTER — Ambulatory Visit: Payer: PPO

## 2023-05-03 ENCOUNTER — Ambulatory Visit: Payer: PPO

## 2023-05-03 VITALS — BP 136/80

## 2023-05-03 DIAGNOSIS — I10 Essential (primary) hypertension: Secondary | ICD-10-CM

## 2023-05-03 NOTE — Progress Notes (Cosign Needed)
Patient presents for bp check. Bp today was 136/80 checked on upper arm vs wrist as patient did not request or complain that cuff was too uncomfortable. Lab results also given to patient and a copy provided.

## 2023-05-06 ENCOUNTER — Telehealth: Payer: Self-pay | Admitting: Family Medicine

## 2023-05-06 NOTE — Telephone Encounter (Signed)
   Susan Holloway has been scheduled for the following appointment:  WHAT: BONE DENSITY WHERE: [facility] DATE: 07/07/2023 TIME: 7:30 AM CHECK-IN  Patient has been made aware.

## 2023-05-07 DIAGNOSIS — Z6831 Body mass index (BMI) 31.0-31.9, adult: Secondary | ICD-10-CM | POA: Diagnosis not present

## 2023-05-07 DIAGNOSIS — J309 Allergic rhinitis, unspecified: Secondary | ICD-10-CM | POA: Diagnosis not present

## 2023-05-07 DIAGNOSIS — R0982 Postnasal drip: Secondary | ICD-10-CM | POA: Diagnosis not present

## 2023-05-27 DIAGNOSIS — R1012 Left upper quadrant pain: Secondary | ICD-10-CM | POA: Diagnosis not present

## 2023-05-27 DIAGNOSIS — Z6830 Body mass index (BMI) 30.0-30.9, adult: Secondary | ICD-10-CM | POA: Diagnosis not present

## 2023-05-27 DIAGNOSIS — R197 Diarrhea, unspecified: Secondary | ICD-10-CM | POA: Diagnosis not present

## 2023-05-28 DIAGNOSIS — R197 Diarrhea, unspecified: Secondary | ICD-10-CM | POA: Diagnosis not present

## 2023-06-01 DIAGNOSIS — Z6831 Body mass index (BMI) 31.0-31.9, adult: Secondary | ICD-10-CM | POA: Diagnosis not present

## 2023-06-01 DIAGNOSIS — E876 Hypokalemia: Secondary | ICD-10-CM | POA: Diagnosis not present

## 2023-06-01 DIAGNOSIS — A09 Infectious gastroenteritis and colitis, unspecified: Secondary | ICD-10-CM | POA: Diagnosis not present

## 2023-06-01 DIAGNOSIS — J984 Other disorders of lung: Secondary | ICD-10-CM | POA: Diagnosis not present

## 2023-06-24 DIAGNOSIS — E531 Pyridoxine deficiency: Secondary | ICD-10-CM | POA: Diagnosis not present

## 2023-06-24 DIAGNOSIS — Z1331 Encounter for screening for depression: Secondary | ICD-10-CM | POA: Diagnosis not present

## 2023-06-24 DIAGNOSIS — R7302 Impaired glucose tolerance (oral): Secondary | ICD-10-CM | POA: Diagnosis not present

## 2023-06-24 DIAGNOSIS — E876 Hypokalemia: Secondary | ICD-10-CM | POA: Diagnosis not present

## 2023-06-24 DIAGNOSIS — Z13820 Encounter for screening for osteoporosis: Secondary | ICD-10-CM | POA: Diagnosis not present

## 2023-06-24 DIAGNOSIS — Z0000 Encounter for general adult medical examination without abnormal findings: Secondary | ICD-10-CM | POA: Diagnosis not present

## 2023-06-24 DIAGNOSIS — R2681 Unsteadiness on feet: Secondary | ICD-10-CM | POA: Diagnosis not present

## 2023-06-24 DIAGNOSIS — D72829 Elevated white blood cell count, unspecified: Secondary | ICD-10-CM | POA: Diagnosis not present

## 2023-06-24 DIAGNOSIS — N1831 Chronic kidney disease, stage 3a: Secondary | ICD-10-CM | POA: Diagnosis not present

## 2023-06-24 DIAGNOSIS — Z136 Encounter for screening for cardiovascular disorders: Secondary | ICD-10-CM | POA: Diagnosis not present

## 2023-06-24 DIAGNOSIS — E559 Vitamin D deficiency, unspecified: Secondary | ICD-10-CM | POA: Diagnosis not present

## 2023-06-24 DIAGNOSIS — E538 Deficiency of other specified B group vitamins: Secondary | ICD-10-CM | POA: Diagnosis not present

## 2023-08-02 DIAGNOSIS — R2681 Unsteadiness on feet: Secondary | ICD-10-CM | POA: Diagnosis not present

## 2023-08-02 DIAGNOSIS — M6281 Muscle weakness (generalized): Secondary | ICD-10-CM | POA: Diagnosis not present

## 2023-08-02 DIAGNOSIS — R296 Repeated falls: Secondary | ICD-10-CM | POA: Diagnosis not present

## 2023-08-09 DIAGNOSIS — R2681 Unsteadiness on feet: Secondary | ICD-10-CM | POA: Diagnosis not present

## 2023-08-09 DIAGNOSIS — M6281 Muscle weakness (generalized): Secondary | ICD-10-CM | POA: Diagnosis not present

## 2023-08-09 DIAGNOSIS — R296 Repeated falls: Secondary | ICD-10-CM | POA: Diagnosis not present

## 2023-09-14 DIAGNOSIS — R2681 Unsteadiness on feet: Secondary | ICD-10-CM | POA: Diagnosis not present

## 2023-09-14 DIAGNOSIS — R296 Repeated falls: Secondary | ICD-10-CM | POA: Diagnosis not present

## 2023-09-14 DIAGNOSIS — M6281 Muscle weakness (generalized): Secondary | ICD-10-CM | POA: Diagnosis not present

## 2023-10-14 DIAGNOSIS — N1831 Chronic kidney disease, stage 3a: Secondary | ICD-10-CM | POA: Diagnosis not present

## 2023-10-14 DIAGNOSIS — E538 Deficiency of other specified B group vitamins: Secondary | ICD-10-CM | POA: Diagnosis not present

## 2023-10-14 DIAGNOSIS — Z6831 Body mass index (BMI) 31.0-31.9, adult: Secondary | ICD-10-CM | POA: Diagnosis not present

## 2023-10-14 DIAGNOSIS — R0609 Other forms of dyspnea: Secondary | ICD-10-CM | POA: Diagnosis not present

## 2023-10-14 DIAGNOSIS — E559 Vitamin D deficiency, unspecified: Secondary | ICD-10-CM | POA: Diagnosis not present

## 2023-10-14 DIAGNOSIS — R2681 Unsteadiness on feet: Secondary | ICD-10-CM | POA: Diagnosis not present

## 2023-11-11 DIAGNOSIS — R400 Somnolence: Secondary | ICD-10-CM | POA: Diagnosis not present

## 2023-11-11 DIAGNOSIS — R2681 Unsteadiness on feet: Secondary | ICD-10-CM | POA: Diagnosis not present

## 2023-11-19 DIAGNOSIS — R2689 Other abnormalities of gait and mobility: Secondary | ICD-10-CM | POA: Diagnosis not present

## 2023-11-22 DIAGNOSIS — R2689 Other abnormalities of gait and mobility: Secondary | ICD-10-CM | POA: Diagnosis not present

## 2023-11-26 DIAGNOSIS — R2689 Other abnormalities of gait and mobility: Secondary | ICD-10-CM | POA: Diagnosis not present

## 2023-12-13 DIAGNOSIS — G4733 Obstructive sleep apnea (adult) (pediatric): Secondary | ICD-10-CM | POA: Diagnosis not present

## 2023-12-13 DIAGNOSIS — Z6831 Body mass index (BMI) 31.0-31.9, adult: Secondary | ICD-10-CM | POA: Diagnosis not present

## 2023-12-13 DIAGNOSIS — N1831 Chronic kidney disease, stage 3a: Secondary | ICD-10-CM | POA: Diagnosis not present

## 2023-12-13 DIAGNOSIS — J301 Allergic rhinitis due to pollen: Secondary | ICD-10-CM | POA: Diagnosis not present

## 2024-01-14 DIAGNOSIS — G4733 Obstructive sleep apnea (adult) (pediatric): Secondary | ICD-10-CM | POA: Diagnosis not present

## 2024-01-14 DIAGNOSIS — Z6830 Body mass index (BMI) 30.0-30.9, adult: Secondary | ICD-10-CM | POA: Diagnosis not present

## 2024-01-14 DIAGNOSIS — S0990XS Unspecified injury of head, sequela: Secondary | ICD-10-CM | POA: Diagnosis not present

## 2024-01-14 DIAGNOSIS — R350 Frequency of micturition: Secondary | ICD-10-CM | POA: Diagnosis not present

## 2024-01-14 DIAGNOSIS — S0003XD Contusion of scalp, subsequent encounter: Secondary | ICD-10-CM | POA: Diagnosis not present
